# Patient Record
Sex: Female | Born: 1950 | Race: Black or African American | Hispanic: No | Marital: Married | Smoking: Never smoker
Health system: Southern US, Community
[De-identification: ages and names within clinical notes are randomized; demographics above are authoritative.]

## PROBLEM LIST (undated history)

## (undated) DIAGNOSIS — G629 Polyneuropathy, unspecified: Secondary | ICD-10-CM

## (undated) DIAGNOSIS — G510 Bell's palsy: Secondary | ICD-10-CM

## (undated) DIAGNOSIS — I10 Essential (primary) hypertension: Secondary | ICD-10-CM

## (undated) DIAGNOSIS — I639 Cerebral infarction, unspecified: Secondary | ICD-10-CM

## (undated) DIAGNOSIS — R609 Edema, unspecified: Secondary | ICD-10-CM

## (undated) DIAGNOSIS — M1990 Unspecified osteoarthritis, unspecified site: Secondary | ICD-10-CM

## (undated) DIAGNOSIS — E119 Type 2 diabetes mellitus without complications: Secondary | ICD-10-CM

## (undated) HISTORY — PX: THORACIC OUTLET SURGERY: SHX2502

## (undated) HISTORY — PX: BACK SURGERY: SHX140

## (undated) HISTORY — DX: Cerebral infarction, unspecified: I63.9

## (undated) HISTORY — PX: CARPAL TUNNEL RELEASE: SHX101

## (undated) HISTORY — PX: FIRST RIB REMOVAL: SHX642

## (undated) HISTORY — PX: NERVE SURGERY: SHX1016

## (undated) HISTORY — DX: Type 2 diabetes mellitus without complications: E11.9

## (undated) HISTORY — PX: ABDOMINAL HYSTERECTOMY: SHX81

## (undated) HISTORY — PX: APPENDECTOMY: SHX54

---

## 2007-07-07 DIAGNOSIS — I639 Cerebral infarction, unspecified: Secondary | ICD-10-CM

## 2007-07-07 HISTORY — DX: Cerebral infarction, unspecified: I63.9

## 2008-07-06 HISTORY — PX: LAPAROSCOPIC HYSTERECTOMY: SHX1926

## 2011-06-02 ENCOUNTER — Emergency Department: Payer: Self-pay | Admitting: Emergency Medicine

## 2011-08-21 ENCOUNTER — Ambulatory Visit: Payer: Self-pay | Admitting: Gastroenterology

## 2011-09-04 ENCOUNTER — Observation Stay: Payer: Self-pay | Admitting: Internal Medicine

## 2011-09-04 LAB — URINALYSIS, COMPLETE
Bacteria: NONE SEEN
Bilirubin,UR: NEGATIVE
Ketone: NEGATIVE
Protein: NEGATIVE
RBC,UR: 1 /HPF (ref 0–5)
Specific Gravity: 1.028 (ref 1.003–1.030)
Squamous Epithelial: NONE SEEN
WBC UR: 2 /HPF (ref 0–5)

## 2011-09-04 LAB — CBC WITH DIFFERENTIAL/PLATELET
Basophil #: 0 10*3/uL (ref 0.0–0.1)
Basophil %: 0.3 %
HCT: 46.8 % (ref 35.0–47.0)
Lymphocyte #: 4 10*3/uL — ABNORMAL HIGH (ref 1.0–3.6)
MCH: 30.2 pg (ref 26.0–34.0)
MCV: 90 fL (ref 80–100)
Monocyte #: 0.9 10*3/uL — ABNORMAL HIGH (ref 0.0–0.7)
Monocyte %: 7.5 %
Neutrophil #: 7.1 10*3/uL — ABNORMAL HIGH (ref 1.4–6.5)
Neutrophil %: 58.3 %
Platelet: 322 10*3/uL (ref 150–440)
RDW: 13.1 % (ref 11.5–14.5)

## 2011-09-04 LAB — COMPREHENSIVE METABOLIC PANEL
Albumin: 3.6 g/dL (ref 3.4–5.0)
Alkaline Phosphatase: 67 U/L (ref 50–136)
Anion Gap: 11 (ref 7–16)
Bilirubin,Total: 0.3 mg/dL (ref 0.2–1.0)
Calcium, Total: 9.9 mg/dL (ref 8.5–10.1)
Chloride: 98 mmol/L (ref 98–107)
Creatinine: 0.73 mg/dL (ref 0.60–1.30)
EGFR (African American): >60
Glucose: 360 mg/dL — ABNORMAL HIGH (ref 65–99)
Osmolality: 287 (ref 275–301)
Potassium: 4.7 mmol/L (ref 3.5–5.1)
Sodium: 137 mmol/L (ref 136–145)
Total Protein: 8.9 g/dL — ABNORMAL HIGH (ref 6.4–8.2)

## 2011-09-05 LAB — CBC WITH DIFFERENTIAL/PLATELET
Basophil #: 0 10*3/uL (ref 0.0–0.1)
Basophil %: 0.1 %
Eosinophil %: 1.9 %
HCT: 44 % (ref 35.0–47.0)
HGB: 14.6 g/dL (ref 12.0–16.0)
Lymphocyte #: 4.1 10*3/uL — ABNORMAL HIGH (ref 1.0–3.6)
MCH: 29.8 pg (ref 26.0–34.0)
MCV: 90 fL (ref 80–100)
Monocyte #: 0.7 10*3/uL (ref 0.0–0.7)
Monocyte %: 6.4 %
Neutrophil #: 5.4 10*3/uL (ref 1.4–6.5)
RBC: 4.89 10*6/uL (ref 3.80–5.20)
WBC: 10.3 10*3/uL (ref 3.6–11.0)

## 2011-09-05 LAB — BASIC METABOLIC PANEL
Anion Gap: 12 (ref 7–16)
BUN: 5 mg/dL — ABNORMAL LOW (ref 7–18)
Calcium, Total: 8.9 mg/dL (ref 8.5–10.1)
Chloride: 102 mmol/L (ref 98–107)
Co2: 27 mmol/L (ref 21–32)
Creatinine: 0.64 mg/dL (ref 0.60–1.30)
Osmolality: 284 (ref 275–301)
Potassium: 3.8 mmol/L (ref 3.5–5.1)

## 2011-09-05 LAB — HEMOGLOBIN A1C: Hemoglobin A1C: 13.7 % — ABNORMAL HIGH (ref 4.2–6.3)

## 2011-09-06 LAB — BASIC METABOLIC PANEL
Anion Gap: 7 (ref 7–16)
Calcium, Total: 8.8 mg/dL (ref 8.5–10.1)
Chloride: 107 mmol/L (ref 98–107)
Co2: 28 mmol/L (ref 21–32)
EGFR (African American): >60
Glucose: 127 mg/dL — ABNORMAL HIGH (ref 65–99)
Osmolality: 281 (ref 275–301)
Sodium: 142 mmol/L (ref 136–145)

## 2011-09-06 LAB — CBC WITH DIFFERENTIAL/PLATELET
Basophil #: 0 10*3/uL (ref 0.0–0.1)
Eosinophil #: 0.3 10*3/uL (ref 0.0–0.7)
Lymphocyte #: 3.2 10*3/uL (ref 1.0–3.6)
MCH: 30.1 pg (ref 26.0–34.0)
MCHC: 33.6 g/dL (ref 32.0–36.0)
MCV: 90 fL (ref 80–100)
Monocyte #: 0.7 10*3/uL (ref 0.0–0.7)
Platelet: 286 10*3/uL (ref 150–440)
RDW: 12.6 % (ref 11.5–14.5)

## 2011-09-06 LAB — STOOL CULTURE

## 2013-04-03 ENCOUNTER — Ambulatory Visit: Payer: Self-pay | Admitting: Internal Medicine

## 2013-04-14 ENCOUNTER — Ambulatory Visit: Payer: Self-pay | Admitting: Internal Medicine

## 2013-07-06 HISTORY — PX: CATARACT EXTRACTION: SUR2

## 2013-10-02 ENCOUNTER — Encounter (INDEPENDENT_AMBULATORY_CARE_PROVIDER_SITE_OTHER): Payer: Self-pay | Admitting: Ophthalmology

## 2013-10-25 ENCOUNTER — Encounter (INDEPENDENT_AMBULATORY_CARE_PROVIDER_SITE_OTHER): Payer: Medicare PPO | Admitting: Ophthalmology

## 2013-10-25 DIAGNOSIS — I10 Essential (primary) hypertension: Secondary | ICD-10-CM

## 2013-10-25 DIAGNOSIS — E1165 Type 2 diabetes mellitus with hyperglycemia: Secondary | ICD-10-CM

## 2013-10-25 DIAGNOSIS — H2510 Age-related nuclear cataract, unspecified eye: Secondary | ICD-10-CM

## 2013-10-25 DIAGNOSIS — E11319 Type 2 diabetes mellitus with unspecified diabetic retinopathy without macular edema: Secondary | ICD-10-CM

## 2013-10-25 DIAGNOSIS — H43819 Vitreous degeneration, unspecified eye: Secondary | ICD-10-CM

## 2013-10-25 DIAGNOSIS — E1139 Type 2 diabetes mellitus with other diabetic ophthalmic complication: Secondary | ICD-10-CM

## 2013-10-25 DIAGNOSIS — E11311 Type 2 diabetes mellitus with unspecified diabetic retinopathy with macular edema: Secondary | ICD-10-CM

## 2013-10-25 DIAGNOSIS — H35039 Hypertensive retinopathy, unspecified eye: Secondary | ICD-10-CM

## 2013-11-20 ENCOUNTER — Inpatient Hospital Stay: Payer: Self-pay | Admitting: Internal Medicine

## 2013-11-20 LAB — CK TOTAL AND CKMB (NOT AT ARMC)
CK, Total: 130 U/L
CK, Total: 118 U/L
CK-MB: <0.5 ng/mL — ABNORMAL LOW (ref 0.5–3.6)
CK-MB: 0.5 ng/mL (ref 0.5–3.6)

## 2013-11-20 LAB — COMPREHENSIVE METABOLIC PANEL
ALBUMIN: 3.1 g/dL — AB (ref 3.4–5.0)
Alkaline Phosphatase: 66 U/L
Anion Gap: 5 — ABNORMAL LOW (ref 7–16)
BILIRUBIN TOTAL: 0.2 mg/dL (ref 0.2–1.0)
BUN: 12 mg/dL (ref 7–18)
CO2: 30 mmol/L (ref 21–32)
CREATININE: 0.78 mg/dL (ref 0.60–1.30)
Calcium, Total: 9.4 mg/dL (ref 8.5–10.1)
Chloride: 106 mmol/L (ref 98–107)
EGFR (Non-African Amer.): >60
Glucose: 72 mg/dL (ref 65–99)
OSMOLALITY: 280 (ref 275–301)
POTASSIUM: 3.7 mmol/L (ref 3.5–5.1)
SGOT(AST): 31 U/L (ref 15–37)
SGPT (ALT): 27 U/L (ref 12–78)
SODIUM: 141 mmol/L (ref 136–145)
Total Protein: 7.8 g/dL (ref 6.4–8.2)

## 2013-11-20 LAB — URINALYSIS, COMPLETE
BILIRUBIN, UR: NEGATIVE
BLOOD: NEGATIVE
Bacteria: NONE SEEN
GLUCOSE, UR: NEGATIVE mg/dL (ref 0–75)
Ketone: NEGATIVE
Leukocyte Esterase: NEGATIVE
Nitrite: NEGATIVE
Ph: 6 (ref 4.5–8.0)
Protein: 30
RBC,UR: NONE SEEN /HPF (ref 0–5)
SPECIFIC GRAVITY: 1.011 (ref 1.003–1.030)
SQUAMOUS EPITHELIAL: NONE SEEN
WBC UR: <1 /HPF (ref 0–5)

## 2013-11-20 LAB — CBC
HCT: 40 % (ref 35.0–47.0)
HGB: 13.7 g/dL (ref 12.0–16.0)
MCH: 31.2 pg (ref 26.0–34.0)
MCHC: 34.1 g/dL (ref 32.0–36.0)
MCV: 92 fL (ref 80–100)
PLATELETS: 385 10*3/uL (ref 150–440)
RBC: 4.37 10*6/uL (ref 3.80–5.20)
RDW: 13 % (ref 11.5–14.5)
WBC: 10.7 10*3/uL (ref 3.6–11.0)

## 2013-11-20 LAB — TROPONIN I
Troponin-I: <0.02 ng/mL
Troponin-I: <0.02 ng/mL

## 2013-11-21 LAB — COMPREHENSIVE METABOLIC PANEL
AST: 29 U/L (ref 15–37)
Albumin: 3 g/dL — ABNORMAL LOW (ref 3.4–5.0)
Alkaline Phosphatase: 63 U/L
Anion Gap: 5 — ABNORMAL LOW (ref 7–16)
BUN: 8 mg/dL (ref 7–18)
Bilirubin,Total: 0.4 mg/dL (ref 0.2–1.0)
CALCIUM: 9.3 mg/dL (ref 8.5–10.1)
CHLORIDE: 104 mmol/L (ref 98–107)
CO2: 31 mmol/L (ref 21–32)
CREATININE: 0.52 mg/dL — AB (ref 0.60–1.30)
EGFR (Non-African Amer.): >60
GLUCOSE: 50 mg/dL — AB (ref 65–99)
Osmolality: 275 (ref 275–301)
Potassium: 3.3 mmol/L — ABNORMAL LOW (ref 3.5–5.1)
SGPT (ALT): 26 U/L (ref 12–78)
SODIUM: 140 mmol/L (ref 136–145)
TOTAL PROTEIN: 7.6 g/dL (ref 6.4–8.2)

## 2013-11-21 LAB — LIPID PANEL
Cholesterol: 178 mg/dL (ref 0–200)
HDL Cholesterol: 74 mg/dL — ABNORMAL HIGH (ref 40–60)
Ldl Cholesterol, Calc: 69 mg/dL (ref 0–100)
TRIGLYCERIDES: 177 mg/dL (ref 0–200)
VLDL CHOLESTEROL, CALC: 35 mg/dL (ref 5–40)

## 2013-11-21 LAB — CBC WITH DIFFERENTIAL/PLATELET
BASOS ABS: 0 10*3/uL (ref 0.0–0.1)
Basophil %: 0.5 %
Eosinophil #: 0.3 10*3/uL (ref 0.0–0.7)
Eosinophil %: 3.3 %
HCT: 39.3 % (ref 35.0–47.0)
HGB: 13.4 g/dL (ref 12.0–16.0)
LYMPHS PCT: 38.9 %
Lymphocyte #: 3.7 10*3/uL — ABNORMAL HIGH (ref 1.0–3.6)
MCH: 31.4 pg (ref 26.0–34.0)
MCHC: 34.2 g/dL (ref 32.0–36.0)
MCV: 92 fL (ref 80–100)
MONO ABS: 1 x10 3/mm — AB (ref 0.2–0.9)
Monocyte %: 10.1 %
Neutrophil #: 4.5 10*3/uL (ref 1.4–6.5)
Neutrophil %: 47.2 %
Platelet: 367 10*3/uL (ref 150–440)
RBC: 4.27 10*6/uL (ref 3.80–5.20)
RDW: 13.2 % (ref 11.5–14.5)
WBC: 9.4 10*3/uL (ref 3.6–11.0)

## 2013-11-22 ENCOUNTER — Encounter (INDEPENDENT_AMBULATORY_CARE_PROVIDER_SITE_OTHER): Payer: Medicare PPO | Admitting: Ophthalmology

## 2013-11-22 LAB — HEMOGLOBIN A1C: HEMOGLOBIN A1C: 7.7 % — AB (ref 4.2–6.3)

## 2013-12-05 ENCOUNTER — Ambulatory Visit: Payer: Self-pay | Admitting: Internal Medicine

## 2014-10-27 NOTE — Consult Note (Signed)
Referring Physician:  Theodoro Grist :   Primary Care Physician:  Theodoro Grist : [facility], [address] [place], [place] [address], [place]  Reason for Consult: Admit Date: 19-Nov-2013  Chief Complaint: L sided weakness  Reason for Consult: CVA   History of Present Illness: History of Present Illness:   64 yo RHD F presents to [facility] secondary to acute onset of L sided weakness in arm and face when she woke up 4 days ago this has persisted since that time and only mildly improved.  Pt reports headache that is severe as well when she does not normal get headaches.  She denies any vision changes but does state that her speech is more slurred. She is really concerned that she is having something more than a stroke.    ROS:  General weakness   HEENT no complaints   Lungs no complaints   Cardiac no complaints   GI no complaints   GU no complaints   Musculoskeletal no complaints   Extremities no complaints   Skin no complaints   Neuro headache  numbness/tingling   Endocrine no complaints   Psych no complaints   Past Medical/Surgical Hx:  Cataracts:   Bells Palsy:   Gastric paresis:   H-pylori:   bladder CA:   CVA/Stroke:   Diabetes:   Past Medical/ Surgical Hx:  Past Medical History as above   Past Surgical History as above   Home Medications: Medication Instructions Last Modified Date/Time  clopidogrel 75 mg oral tablet 1 tab(s) orally once a day 20-May-15 11:26  predniSONE 10 mg oral tablet 1 tab(s) orally once a day x 5 days 4 tab(s) orally once a day 3 tab(s) orally once a day 2 tab(s) orally once a day 1 tab(s) orally once a day  To be started after 7 day therapy with 60 mg daily dose 20-May-15 11:26  predniSONE 20 mg oral tablet 3 tab(s) orally once a day 20-May-15 11:26  amLODIPine 10 mg oral tablet 1 tab(s) orally once a day 20-May-15 11:26  valACYclovir 1 g oral tablet 1 tab(s) orally every 8 hours 20-May-15 11:26   acetaminophen-codeine #3 1 tab(s) orally every 6 hours, As Needed 18-May-15 12:12  Lantus 100 units/mL subcutaneous solution 15 unit(s) subcutaneous once a day (at bedtime) 19-May-15 16:21   Allergies:  Sulfa drugs: Anaphylaxis  Lisinopril: Hives  Statins: Unknown  Social/Family History: Lives With: significant other  Living Arrangements: house  Social History: no tob, no EtOh, 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, retired PA  Family History: no strokes but + DM   Vital Signs: **Vital Signs.:   20-May-15 11:50  Vital Signs Type Q 4hr  Temperature Temperature (F) 98.2  Celsius 36.7  Temperature Source oral  Pulse Pulse 71  Respirations Respirations 18  Systolic BP Systolic BP 546  Diastolic BP (mmHg) Diastolic BP (mmHg) 77  Mean BP 89  Pulse Ox % Pulse Ox % 97  Pulse Ox Activity Level  At rest  Oxygen Delivery Room Air/ 21 %   Physical Exam: General: mildly overweight, NAD, anxious mildly  HEENT: PERRLA, EOMI, nl VF, face symmetric, tongue midline, shoulder shrug equal  Neck: supple, no JVD, no bruits  Chest: CTA B, no wheezing, good movement  Cardiac: RRR, no murmurs, no edema, 2+ pulses  Extremities: no C/C/E, FROM   Neurologic Exam: Mental Status: alert and oriented x 3, normal language, follows complex commands, moderate dysarthria  Cranial Nerves: PERRLA, EOMI, nl VF, 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,  tongue midline, shoulder shrug equal, decreased hearing on L ear  Motor Exam: 5-/5 B, give-away weakness in L UE with nl tone  Deep Tendon Reflexes: 2+/4 B, plantars downgoing B, no Hoffman  Sensory Exam: spliting of the center line with facial pinpric;  vibration changes over the forehead  Coordination: FTN and HTS WNL, nl RAM   Lab Results: LabObservation:  18-May-15 14:16   OBSERVATION Reason for Test  Hepatic:  19-May-15 04:39   Bilirubin, Total 0.4  Alkaline Phosphatase 63 (45-117 NOTE: New Reference Range 05/26/13)  SGPT (ALT) 26  SGOT (AST) 29  Total Protein, Serum 7.6   Albumin, Serum  3.0  Routine Chem:  19-May-15 04:39   Hemoglobin A1c ([facility])  7.7 (The American Diabetes Association recommends that a primary goal of therapy should be <7% and that physicians should reevaluate the treatment regimen in patients with HbA1c values consistently >8%.)  Cholesterol, Serum 178  Triglycerides, Serum 177  HDL (INHOUSE)  74  VLDL Cholesterol Calculated 35  LDL Cholesterol Calculated 69 (Result(s) reported on 21 Nov 2013 at 05:58AM.)  Glucose, Serum  50  BUN 8  Creatinine (comp)  0.52  Sodium, Serum 140  Potassium, Serum  3.3  Chloride, Serum 104  CO2, Serum 31  Calcium (Total), Serum 9.3  Osmolality (calc) 275  eGFR (African American) >60  eGFR (Non-African American) >60 (eGFR values <16m/min/1.73 m2 may be an indication of chronic kidney disease (CKD). Calculated eGFR is useful in patients with stable renal function. The eGFR calculation will not be reliable in acutely ill patients when serum creatinine is changing rapidly. It is not useful in  patients on dialysis. The eGFR calculation may not be applicable to patients at the low and high extremes of body sizes, pregnant women, and vegetarians.)  Anion Gap  5  Cardiac:  18-May-15 19:51   Troponin I < 0.02 (0.00-0.05 0.05 ng/mL or less: NEGATIVE  Repeat testing in 3-6 hrs  if clinically indicated. >0.05 ng/mL: POTENTIAL  MYOCARDIAL INJURY. Repeat  testing in 3-6 hrs if  clinically indicated. NOTE: An increase or decrease  of 30% or more on serial  testing suggests a  clinically important change)  CK, Total 118 (26-192 NOTE: NEW REFERENCE RANGE  08/07/2013)  CPK-MB, Serum 0.5 (Result(s) reported on 20 Nov 2013 at 08:26PM.)  Routine UA:  18-May-15 12:16   Color (UA) Yellow  Clarity (UA) Clear  Glucose (UA) Negative  Bilirubin (UA) Negative  Ketones (UA) Negative  Specific Gravity (UA) 1.011  Blood (UA) Negative  pH (UA) 6.0  Protein (UA) 30 mg/dL  Nitrite (UA) Negative  Leukocyte  Esterase (UA) Negative (Result(s) reported on 20 Nov 2013 at 12:47PM.)  RBC (UA) NONE SEEN  WBC (UA) <1 /HPF  Bacteria (UA) NONE SEEN  Epithelial Cells (UA) NONE SEEN  Mucous (UA) PRESENT (Result(s) reported on 20 Nov 2013 at 12:47PM.)  Routine Hem:  19-May-15 04:39   WBC (CBC) 9.4  RBC (CBC) 4.27  Hemoglobin (CBC) 13.4  Hematocrit (CBC) 39.3  Platelet Count (CBC) 367  MCV 92  MCH 31.4  MCHC 34.2  RDW 13.2  Neutrophil % 47.2  Lymphocyte % 38.9  Monocyte % 10.1  Eosinophil % 3.3  Basophil % 0.5  Neutrophil # 4.5  Lymphocyte #  3.7  Monocyte #  1.0  Eosinophil # 0.3  Basophil # 0.0 (Result(s) reported on 21 Nov 2013 at 05:50AM.)   Radiology Results: [place]    18-May-15 16:12, [place] B[place]  [place]  Bilateral   REASON FOR EXAM:    CVA with L sided weakness  COMMENTS:       PROCEDURE: [place]  - US CAROTID DOPPLER BILATERAL  - Nov 20 2013  4:12PM     CLINICAL DATA:  Cerebral vascular accident, left-sided weakness    EXAM:  BILATERAL CAROTID DUPLEX ULTRASOUND    TECHNIQUE:  Pearline Cables scale imaging, color Doppler and duplex ultrasound were  performed of bilateral carotid and vertebral arteries in the neck.    COMPARISON:  Head CT 11/20/2013  FINDINGS:  Criteria: Quantification of carotid stenosis is based on velocity  parameters that correlate the residual internal carotid diameter  with NASCET-based stenosis levels, using the diameter of the distal  internal carotid lumen as the denominator for stenosis measurement.    The following velocity measurements were obtained:    RIGHT    ICA:  96/14 cm/sec    CCA:  93/71 cm/sec    SYSTOLIC ICA/CCA RATIO:  1.1  DIASTOLIC ICA/CCA RATIO:  0.8    ECA:  90 cm/sec    LEFT    ICA:  114/34 cm/sec    CCA:  69/67 cm/sec    SYSTOLIC ICA/CCA RATIO:  1.3    DIASTOLIC ICA/CCA RATIO:  2.0    ECA:  78 cm/sec  RIGHT CAROTID ARTERY: Mild smooth heterogeneous atherosclerotic  plaque in the proximal internal  carotid artery resulting in less  than 50% diameter narrowing.    RIGHT VERTEBRAL ARTERY:  Patent with normal antegrade flow.    LEFT CAROTID ARTERY: No significant atherosclerotic plaque or  evidence of stenosis.    LEFT VERTEBRAL ARTERY:  Patent with normal antegrade flow.     IMPRESSION:  1. Very mild smooth plaque results in a less than 50% diameter  narrowing in the right ICA.  2. No significant atherosclerotic plaque or evidence of stenosis on  the left.  3. Vertebral arteries are patent with normal antegrade flow.  Signed,    Criselda Peaches, MD    [facility]    [facility]      Electronically Signed    By: Jacqulynn Cadet M.D.    On: 11/20/2013 17:09     Verified By: Criselda Peaches, M.D.,  CT:    18-May-15 11:35, CT Head Without Contrast  CT Head Without Contrast   REASON FOR EXAM:    CVA  COMMENTS:   May transport without cardiac monitor    PROCEDURE: CT  - CT HEAD WITHOUT CONTRAST  - Nov 20 2013 11:35AM     CLINICAL DATA:  Slurred speech and facial droop; left-sided weakness    EXAM:  CT HEAD WITHOUT CONTRAST    TECHNIQUE:  Contiguous axial images were obtained from the base of the skull  through the vertex without intravenous contrast. Study was obtained  within 24 hr of patient's arrival at the emergency department.  COMPARISON:  None.    FINDINGS:  Theventricles are normal in size and configuration. There is no  appreciable mass, hemorrhage, extra-axial fluid collection, or  midline shift. No focal gray-white compartment lesions are  identified. There is no demonstrable acute infarct. Bony calvarium  appears intact. The mastoid air cells are clear.     IMPRESSION:  Study within normal limits. No appreciable mass, hemorrhage, or  acute appearing infarct.      Electronically Signed    By: Lowella Grip M.D.    On: 11/20/2013 12:01  Verified By: Leafy Kindle. 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 L hemiparesis-  exam is inconsistent and has undertones of conversion disorder with vibration changes over the forehead as well as splitting agree with Valcyclovir 1gm BID x 10 days also agree with prednisone dose pack needs eye patch and drops for L eye  needs speech OK to go home at anypoint will follow briefly in the hospital  Electronic Signatures: Jamison Neighbor (MD)  (Signed 20-May-15 15:29)  Authored: REFERRING PHYSICIAN, Primary Care Physician, Consult, History of Present Illness, Review of Systems, PAST MEDICAL/SURGICAL HISTORY, HOME MEDICATIONS, ALLERGIES, Social/Family History, NURSING VITAL SIGNS, Physical Exam-, LAB RESULTS, RADIOLOGY RESULTS, Recommendations   Last Updated: 20-May-15 15:29 by Jamison Neighbor (MD)

## 2014-10-27 NOTE — Discharge Summary (Signed)
PATIENT NAME:  Marcia Robbins, Marcia Robbins MR#:  [id number] DATE OF BIRTH:  May 19, 1951  DATE OF ADMISSION:  11/20/2013 DATE OF DISCHARGE:  11/22/2013  ADMITTING DIAGNOSIS: Stroke.   DISCHARGE DIAGNOSES: 1.  Acute left-sided Bell's palsy with dysphagia. 2.  Left-sided weakness of unclear etiology at this time, questionable conversion disorder, resolving. 3.  Malignant hypertension.  4.  Diabetes mellitus type 2 with hemoglobin A1c 7.7. 5.  Hyperlipidemia with LDL of 69.   DISCHARGE MEDICATIONS:   1.  Tylenol with codeine No. 3, 1 tablet every 6 hours as needed.  2.  Insulin Lantus 15 units subcutaneously at bedtime.  3.  Prednisone taper. The patient is to take prednisone 60 mg p.o. daily for 7 days and then taper by 10 mg until stopped. 4.  Plavix 75 mg p.o. daily.  5.  Amlodipine 10 mg p.o. daily.  6.  Valacyclovir 1 gram every 8 hours for 7 days.   The patient is not to take NovoLog Mix 70/30. She is not to take aspirin or Advil.   HOME OXYGEN: None.   DIET: 2 gram salt, low-fat, low-cholesterol, carbohydrate-controlled diet, soft to regular consistency depending on tolerance.  ACTIVITY LIMITATIONS: As tolerated.    DISCHARGE INSTRUCTIONS: 1.  Followup appointment with outpatient physical as well as speech therapy.   2.  Followup appointment with Dr. Maryellen PileEason in 2 days after discharge.  3.  The patient was also advised to protect her eye with moisturizing eye drops over-the-counter. Moisturizing eye drops will be sufficient. The patient was also advised to tape her left eye shut for night to protect cornea from drying.   CONSULTANTS: Neurologist, Dr. Mellody DrownMatthew Smith; care management, social work, physical therapy.   RADIOLOGIC STUDIES: Chest x-ray portable single view, 18th of May 2015, revealed no acute cardiopulmonary disease. CT of the head without contrast, 18th of May 2015, revealed Robbins normal study. Carotid ultrasound, 18th of May 2015, showed smooth plaque formation, which was less than 50%  of diameter narrowing, in the right ICA; no significant atherosclerotic plaque or evidence of stenosis on the left; vertebral arteries were patent with normal antegrade flow. MRI of brain with and without contrast, 19th of May 2015, showed no acute abnormality. Echocardiogram, 18th of May 2015, revealed no source of CVA or TIA; normal global left ventricular systolic function; left ventricular ejection fraction by visual estimation was 60% to 65%; impaired relaxation pattern with left ventricular diastolic filling; also mild left ventricular hypertrophy noted, as well as normal right ventricular size and systolic function and mild mitral valve regurgitation, high-normal right ventricular systolic pressures and questionable PFO.   HOSPITAL COURSE: The patient is Robbins 64 year old Caucasian female with Robbins history of Bell's palsy on the right, who presented to the hospital with complaints of left facial weakness as well as left-sided weakness. Please refer to Dr. Arlys JohnVaickute's admission note on the 18th of May 2015. On arrival to the hospital, the patient's vital signs: Temperature was 98.4. Pulse was 89. Respiratory rate was 18. Blood pressure 221/82. Saturation was 99% on room air. Physical exam revealed left facial weakness including left forehead, also left upper extremity as well as left lower extremity weakness.   The patient was admitted to the hospital with diagnosis of questionable stroke and stroke work-up was entertained. The patient underwent MRI of her brain, which showed no abnormalities. She was evaluated by neurologist, Dr. Mellody DrownMatthew Smith, who felt the patient very likely had one more, this time on the left side, Bell's palsy, which he  was concerned was related to viral disease. He recommended to start the patient on valacyclovir as well as steroid taper. The patient is to continue valacyclovir for approximately 7 days. She is to continue high doses of steroids for 7 days and then taper steroids off. She is  to follow up with her primary care physician for further recommendations. She is to protect her left eye from corneal drying, taping eye shut at nighttime as well as using moisturizing eye drops.   In regards to left-sided weakness, it was of unclear etiology. It was felt to be likely psychological by nature.   For malignant hypertension, the patient's blood pressure was much better controlled on current medications. The patient was advised to continue amlodipine and follow up with primary care physician for further recommendations in regards to management of her hypertension.   In regards to diabetes mellitus, the patient was noted to be hypoglycemic with her usual diabetic medications. Her insulin 70/30 was stopped. The patient was advised to continue to follow up with her primary care physician for further recommendations in regards to diabetes management.  For hyperlipidemia, the patient's LDL was checked and was found to be 69. The patient's total cholesterol was 178, triglycerides were 177, and HDL was 74. Initially since there was Robbins stroke suspected, the patient was started on Robbins statin; however, she refused to take statin. She is being discharged home on no cholesterol medication. She is to follow up with her primary care physician for further recommendations.   The patient is being discharged home with the above-mentioned medications and followup. On the day of discharge, temperature is 98.2. Pulse was 71. Respiratory rate was 18, blood pressure 115/77, saturation 97% on room air at rest.    ____________________________ Marcia Caper, MD rv:jcm D: 11/22/2013 17:29:30 ET T: 11/22/2013 19:56:48 ET JOB#: [id number]  cc: Marcia Caper, MD, <Dictator> Marcia Robbins. Maryellen Pile, MD Marcia Caper MD ELECTRONICALLY SIGNED 12/06/2013 8:36
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
## 2014-10-28 NOTE — Discharge Summary (Signed)
PATIENT NAME:  Marcia Robbins, Marcia Robbins MR#:  [id number] DATE OF BIRTH:  04-25-1951  DATE OF ADMISSION:  09/04/2011 DATE OF DISCHARGE:  09/07/2011  ADMITTING PHYSICIAN: Marcia Baas, MD   DISCHARGING PHYSICIAN: Marcia Baas, MD   PRIMARY CARE PHYSICIAN: Toy Cookey, MD    CONSULTATIONS IN THE HOSPITAL: GI consultation by Dr. Niel Hummer.   DISCHARGE DIAGNOSES:  1. Viral gastroenteritis with nausea, vomiting.  2. Tamoxifen-induced diarrhea.  3. Gastroesophageal reflux disease.  4. Gastroparesis.  5. Insulin-dependent diabetes mellitus with hemoglobin A1c of 13.7.  6. Diabetic neuropathy.  7. Helicobacter Pylori positivity on endoscopy and is to finish  two weeks of IV antibiotics.   DISCHARGE HOME MEDICATIONS:  1. Prevacid 30 mg p.o. b.i.d.  2. Lantus 25 units subcutaneous daily.  3. Reglan 5 mg p.o. t.i.d. prior to meals.  4. Zofran 4 mg p.o. orally-disintegrating tablet every 6  hours p.r.n.  5. Cymbalta 60 mg p.o. daily.  6. Maalox 30 mL p.o. every 6 hours p.r.n. for indigestion.   DISCHARGE DIET: ADA diet.   DISCHARGE ACTIVITY: As tolerated.    FOLLOW-UP INSTRUCTIONS:  1. Primary care physician followup in 1 to 2 weeks.  2. GI followup with Dr. Niel Hummer in 2 weeks.  LABORATORY, DIAGNOSTIC AND RADIOLOGICAL DATA:  Labs at the time of discharge: WBC 8.4, hemoglobin 13.3, hematocrit 40.2, platelet count 286. Sodium 142, potassium 3.8, chloride 107, bicarbonate 28, BUN 3, creatinine 0.61, glucose 127, calcium 8.8, lipase 17.  Hemoglobin A1c 13.7.  Stool culture is negative.  Stool for Clostridium difficile is negative.  Urinalysis: Negative for any infection. ALT 55, AST 45, alkaline phosphatase 67, total bilirubin 0.3, albumin of 3.6.  CT of the abdomen and pelvis with contrast showing normal liver, gallbladder, pancreas, adrenals, spleen and normal appendix. The bladder was nondistended. The bowel was nondistended. Left-sided nephrolithiasis. Kidneys are otherwise unremarkable.  No bowel obstruction noted. No hydronephrosis. The lung bases are clear. No free air is identified. Overall impression is normal CT of the abdomen.   BRIEF HOSPITAL COURSE:  Marcia Robbins is a 64 year old African American female with past medical history significant for diabetes, diabetic neuropathy, who was recently diagnosed with diabetic gastroparesis with an outpatient gastric emptying study which shows only 26% of emptying after 90 minutes. So she has been having some nausea, vomiting for 4 to 5 weeks. She has seen Dr. Niel Hummer for upper GI endoscopy which showed positive H. pylori and gastritis. She was placed on Prevacid and also antibiotics for H. pylori treatment. Since then she has been having acute on chronic nausea and vomiting for one week prior to admission, and she was unable to keep anything down and also had slimy mucoid diarrhea. So, she failed Reglan and Zofran as an outpatient and was sent in for a direct admit from Dr. Jake Church office.   1. Acute viral gastroenteritis, acute nausea and vomiting on top of gastroparesis symptoms: Likely viral illness, as per GI.  She was started on IV fluids, with symptomatic improvement; she was also started on IV Reglan q.i.d. for meals. Initially, she has been only on a clear liquid diet. She was on Zofran, Phenergan p.r.n. and has been gradually advancing diet. Right now she still has some nausea, which probably would be chronic with gastroparesis, but the acute vomiting episodes have been resolved, and she is able to take p.o. soft diet without any problem. GI followup  as an outpatient is recommended.  2. Gastroesophageal reflux disease and H. pylori: She will be on Prevacid  twice a day. She has finished treatment with Flagyl, amoxicillin and also Prevacid for two weeks as an outpatient. She could not tolerate outpatient clarithromycin  secondary to severe headache.  3. Diarrhea: Diarrhea while in the hospital was probably secondary to antibiotic-related.  She was on H. pylori treatment for the first two days into hospitalization with worsening diarrhea. Since she finished her two weeks, antibiotics were stopped and her diarrhea has resolved.  4. Insulin-dependent diabetes mellitus: Her hemoglobin A1c is 13.7. She is being started on Lantus 25 units subcutaneous daily from her prior Humalog 50/50 type insulin. She can continue with Dr. Maryellen PileEason for further management.  5. Diabetic neuropathy: The patient is on Cymbalta.   Her course has been otherwise uneventful in the hospital.   DISCHARGE CONDITION: Stable.   DISCHARGE DISPOSITION: Home.   TIME SPENT ON DISCHARGE: 40 minutes.   ____________________________ Marcia Baasadhika Kristina Mcnorton, MD rk:cbb D: 09/07/2011 14:52:25 ET T: 09/08/2011 13:52:11 ET JOB#: [id number]  cc: Marcia Baasadhika Sadae Arrazola, MD, <Dictator> Lurline DelShaukat Iftikhar, MD Serita ShellerErnest B. Maryellen PileEason, MD Marcia BaasADHIKA Ovida Delagarza MD ELECTRONICALLY SIGNED 09/15/2011 13:57

## 2014-10-28 NOTE — Consult Note (Signed)
PATIENT NAME:  Marcia Robbins, Marcia Robbins MR#:  [id number] DATE OF BIRTH:  [birth year]  DATE OF CONSULTATION:  09/05/2011  REFERRING PHYSICIAN:  Enid Baas, MD CONSULTING PHYSICIAN:  Lurline Del, MD  REASON FOR CONSULTATION: Nausea, vomiting, and diarrhea.   HISTORY OF PRESENT ILLNESS: The patient is a 64 year old African American female with a history of insulin-dependent diabetes, peripheral neuropathy.  The patient was seen about three weeks ago as outpatient with nausea and dyspepsia. An upper GI endoscopy showed gastritis. Biopsies were positive for Helicobacter. The patient was started on triple antibiotic therapy for H. pylori gastritis. A gastric emptying scan was done as well which was abnormal with gastric emptying of 26% at 90 minutes. The patient was already on Reglan 5 mg before meals and at bedtime, which was continued. The patient was admitted to the hospital yesterday with about 4 to 5 days of mucousy diarrhea and continues to be nauseated with frequent vomiting, especially worse within the last several days. The patient is also complaining of upper abdominal discomfort which she believes is secondary to her throwing up. Those symptoms are new and different than her baseline symptom of nausea.   PAST MEDICAL HISTORY: Significant for: 1. Insulin-dependent diabetes.  2. Diabetic neuropathy. 3. Diabetic gastroparesis. 4. History of cerebrovascular accident. 5. Bell's palsy.   HOME MEDICATIONS: Humalog, Cymbalta, vitamin, aspirin, as well as triple antibiotic for H. pylori and Reglan.   ALLERGIES: Lisinopril, sulfa and statins.   PAST SURGICAL HISTORY:  1. Tonsillectomy. 2. Appendectomy.  3. Hysterectomy.  4. Carpal tunnel surgery.   SOCIAL HISTORY: She lives at home. She does not smoke or drink.   FAMILY HISTORY: Unremarkable.   REVIEW OF SYSTEMS: Review of systems is grossly negative except for what is mentioned in the History of Present Illness.   PHYSICAL EXAMINATION:   GENERAL: A fairly well-built female. She does  appear to be septic or toxic.   VITAL SIGNS: Temperature 97.5, pulse 77, respirations 18, blood pressure 173/83.   HEENT: Examination is grossly unremarkable.   NECK: Veins are flat.   LUNGS: Lungs are grossly clear to auscultation bilaterally with fair air entry and no added sounds.   CARDIOVASCULAR: Regular rate and rhythm.    ABDOMEN: Examination showed a slightly distended abdomen. Bowel sounds are positive. Mild diffuse tenderness was noted in the upper abdominal area without any rebound or guarding.   EXTREMITIES: Extremities are without edema.   NEUROLOGICAL: Examination appears to be unremarkable.   LABORATORY, DIAGNOSTIC AND RADIOLOGICAL DATA:  Glucose was 360 on admission. Liver enzymes are unremarkable except for minimal elevation of AST at 45. BUN and creatinine are normal. I do not see serum amylase or lipase being done. White cell count is normal, as well as hemoglobin, and hematocrit, and platelet count.  Stool for Clostridium difficile toxin, WBC is negative. Stool cultures are negative as well.  CT scan of abdomen with contrast is unremarkable.   ASSESSMENT AND PLAN: Patient with diabetic gastroparesis.  Some of her nausea and vomiting is probably related to that. I agree with using Reglan 5 mg before meals IV, although if she continues to be symptomatic this can be increased to 10 mg IV before meals t.i.d. The patient's acute illness with nausea, vomiting, and diarrhea points more towards viral gastroenteritis although the nausea, vomiting, and diarrhea secondary to recent antibiotic use is another consideration. I agree with discontinuing the antibiotics and will continue the supportive care with PPI and antiemetics. The patient had a recent upper GI endoscopy  about three weeks ago which was unremarkable except for gastritis, and therefore I do not believe repeat upper GI endoscopy would be necessary. I will go ahead and  advance her diet a little bit and also obtain a serum lipase level. If the patient continues to be symptomatic, maybe an ultrasound of the gallbladder plus/minus a HIDA scan might be helpful to rule out any biliary pathology. We will follow.   Thank you so much, Dr. Nemiah CommanderKalisetti, for involving me in the care of Marcia Robbins.   ____________________________ Lurline DelShaukat Lennan Malone, MD si:cbb D: 09/05/2011 13:03:54 ET T: 09/05/2011 13:21:33 ET JOB#: [id number]  cc: Lurline DelShaukat [place], MD, <Dictator> Serita ShellerErnest B. Maryellen PileEason, MD Lurline DelSHAUKAT Hawkins Seaman MD ELECTRONICALLY SIGNED 09/05/2011 15:00
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
## 2015-07-18 ENCOUNTER — Encounter: Payer: Self-pay | Admitting: General Surgery

## 2015-07-18 ENCOUNTER — Ambulatory Visit (INDEPENDENT_AMBULATORY_CARE_PROVIDER_SITE_OTHER): Payer: Medicare HMO | Admitting: General Surgery

## 2015-07-18 VITALS — BP 162/82 | HR 80 | Resp 16 | Ht 59.0 in | Wt 125.0 lb

## 2015-07-18 DIAGNOSIS — N764 Abscess of vulva: Secondary | ICD-10-CM

## 2015-07-18 NOTE — Patient Instructions (Addendum)
The patient is aware to call back for any questions or concerns. Return in two to three weeks.

## 2015-07-18 NOTE — Progress Notes (Signed)
Patient ID: Marcia Robbins, female   DOB: 07/03/1951, 65 y.o.   MRN: [id number]  Chief Complaint  Patient presents with  . Other    abscess    HPI Marcia KinsMary A Kayes is a 65 y.o. female here today for a evaluation a left labia abscess. Patient states she noticed this area around Christmas and it has gotten worse. She states it is painful and it stated to drain this morning. She has an antibiotic but has not started it yet. Sh ehas been soaking in epsom salt to help. She is a brittle diabetic and her BS has been high ever since she got this infection I have reviewed the history of present illness with the patient.  HPI  Past Medical History  Diagnosis Date  . Diabetes mellitus without complication (HCC)   . Stroke [facility](HCC) 2009    Past Surgical History  Procedure Laterality Date  . Cataract extraction  2015  . Nerve surgery      C 5,6,7  . Laparoscopic hysterectomy  2010  . Carpal tunnel release Bilateral   . First rib removal      History reviewed. No pertinent family history.  Social History Social History  Substance Use Topics  . Smoking status: Never Smoker   . Smokeless tobacco: Never Used  . Alcohol Use: No    Allergies  Allergen Reactions  . Lisinopril Rash and Cough  . Sulfa Antibiotics Rash    Current Outpatient Prescriptions  Medication Sig Dispense Refill  . cholecalciferol (VITAMIN D) 1000 units tablet Take 1,000 Units by mouth daily.    . insulin aspart protamine- aspart (NOVOLOG MIX 70/30) (70-30) 100 UNIT/ML injection Inject 45 Units into the skin 2 (two) times daily with a meal.    . Insulin Glargine (LANTUS [place]) Inject 90 Units into the skin at bedtime.    . pregabalin (LYRICA) 75 MG capsule Take 75 mg by mouth 2 (two) times daily.     No current facility-administered medications for this visit.    Review of Systems Review of Systems  Constitutional: Positive for fever and chills.  Respiratory: Negative.   Cardiovascular: Negative.     Blood pressure  162/82, pulse 80, resp. rate 16, height 4\' 11"  (1.499 m), weight 125 lb (56.7 kg).  Physical Exam Physical Exam  Constitutional: She is oriented to person, place, and time. She appears well-developed and well-nourished.  HENT:  Mouth/Throat: Oropharynx is clear and moist.  Eyes: Conjunctivae are normal. No scleral icterus.  Neck: Neck supple.  Cardiovascular: Normal rate, regular rhythm and normal heart sounds.   Pulmonary/Chest: Effort normal and breath sounds normal.  Abdominal: Soft. Normal appearance and bowel sounds are normal. There is no tenderness.  Genitourinary:  4x3 cm left labial abscess. Small opening in middle.  Lymphadenopathy:    She has no cervical adenopathy.  Neurological: She is alert and oriented to person, place, and time.  Skin: Skin is warm and dry.  Psychiatric: Her behavior is normal.    Data Reviewed Notes reviewed   Assessment    Left labial abscess    Plan    With consent the abscess was drained.  Area was prepped with betadine. 1ml 1% xylocaine instilled. Cruciate incision made at site of the tiny opening and 1-2 ml pus drained. Bulk of the swelling is indurated,inflamed tissue. C/S sent. 4/4s and sanitary pad for dressing. No immediate problems from procedure  She is to start on her antibiotic today Patient to return in two three weeks.  PCP:  Toy Cookey B This information has been scribed by Dorathy Daft RNBC.   SANKAR,SEEPLAPUTHUR G 07/19/2015, 9:23 AM

## 2015-07-19 ENCOUNTER — Encounter: Payer: Self-pay | Admitting: General Surgery

## 2015-07-23 LAB — ANAEROBIC AND AEROBIC CULTURE

## 2015-08-05 ENCOUNTER — Ambulatory Visit (INDEPENDENT_AMBULATORY_CARE_PROVIDER_SITE_OTHER): Payer: Medicare HMO | Admitting: General Surgery

## 2015-08-05 ENCOUNTER — Encounter: Payer: Self-pay | Admitting: General Surgery

## 2015-08-05 VITALS — BP 118/76 | HR 70 | Resp 12 | Ht 59.0 in | Wt 125.0 lb

## 2015-08-05 DIAGNOSIS — N764 Abscess of vulva: Secondary | ICD-10-CM

## 2015-08-05 NOTE — Progress Notes (Signed)
This is a 65 year old female here today for her two week follow up left labial abscess. Patient states is doing much better with minimal drainage. I have reviewed the history of present illness with the patient.  Left labial swelling has gone down considerably. No redness or tenderness. No new findings  Culture grew staph aureus. Pt has been adequately treated with antibiotics. Patient to return as needed.  PCP:  Maryellen Pile, This information has been scribed by Ples Specter CMA.

## 2015-08-05 NOTE — Patient Instructions (Signed)
Patient to return as needed. 

## 2015-08-06 ENCOUNTER — Encounter: Payer: Self-pay | Admitting: General Surgery

## 2016-06-15 ENCOUNTER — Encounter: Payer: Self-pay | Admitting: *Deleted

## 2016-06-18 ENCOUNTER — Encounter: Payer: Self-pay | Admitting: Anesthesiology

## 2016-06-18 ENCOUNTER — Encounter
Admission: RE | Disposition: A | Discharge status: Home / Self Care | Payer: Self-pay | Source: Ambulatory Visit | Attending: Ophthalmology

## 2016-06-18 ENCOUNTER — Ambulatory Visit
Admission: RE | Admit: 2016-06-18 | Discharge: 2016-06-18 | Disposition: A | Discharge status: Home / Self Care | Payer: Medicare HMO | Source: Ambulatory Visit | Attending: Ophthalmology | Admitting: Ophthalmology

## 2016-06-18 ENCOUNTER — Ambulatory Visit: Payer: Medicare HMO | Admitting: Anesthesiology

## 2016-06-18 DIAGNOSIS — E1136 Type 2 diabetes mellitus with diabetic cataract: Secondary | ICD-10-CM | POA: Insufficient documentation

## 2016-06-18 DIAGNOSIS — M1990 Unspecified osteoarthritis, unspecified site: Secondary | ICD-10-CM | POA: Diagnosis not present

## 2016-06-18 DIAGNOSIS — Z8673 Personal history of transient ischemic attack (TIA), and cerebral infarction without residual deficits: Secondary | ICD-10-CM | POA: Insufficient documentation

## 2016-06-18 DIAGNOSIS — Z79899 Other long term (current) drug therapy: Secondary | ICD-10-CM | POA: Diagnosis not present

## 2016-06-18 DIAGNOSIS — I10 Essential (primary) hypertension: Secondary | ICD-10-CM | POA: Diagnosis not present

## 2016-06-18 DIAGNOSIS — G709 Myoneural disorder, unspecified: Secondary | ICD-10-CM | POA: Insufficient documentation

## 2016-06-18 HISTORY — DX: Edema, unspecified: R60.9

## 2016-06-18 HISTORY — DX: Polyneuropathy, unspecified: G62.9

## 2016-06-18 HISTORY — DX: Unspecified osteoarthritis, unspecified site: M19.90

## 2016-06-18 HISTORY — PX: CATARACT EXTRACTION W/PHACO: SHX586

## 2016-06-18 HISTORY — DX: Essential (primary) hypertension: I10

## 2016-06-18 HISTORY — DX: Bell's palsy: G51.0

## 2016-06-18 LAB — GLUCOSE, CAPILLARY: Glucose-Capillary: 162 mg/dL — ABNORMAL HIGH (ref 65–99)

## 2016-06-18 SURGERY — PHACOEMULSIFICATION, CATARACT, WITH IOL INSERTION
Anesthesia: Monitor Anesthesia Care | Site: Eye | Laterality: Right | Wound class: Clean

## 2016-06-18 MED ORDER — SODIUM CHLORIDE 0.9 % IV SOLN
INTRAVENOUS | Status: DC
  Administered 2016-06-18: 10:00:00 via INTRAVENOUS

## 2016-06-18 MED ORDER — BSS IO SOLN
INTRAOCULAR | Status: DC | PRN
  Administered 2016-06-18: 200 mL via INTRAOCULAR

## 2016-06-18 MED ORDER — MOXIFLOXACIN HCL 0.5 % OP SOLN
OPHTHALMIC | Status: DC | PRN
  Administered 2016-06-18: 9 [drp] via OPHTHALMIC

## 2016-06-18 MED ORDER — LIDOCAINE HCL (PF) 4 % IJ SOLN
INTRAOCULAR | Status: DC | PRN
  Administered 2016-06-18: 4 mL via OPHTHALMIC

## 2016-06-18 MED ORDER — ONDANSETRON HCL 4 MG/2ML IJ SOLN
INTRAMUSCULAR | Status: DC | PRN
  Administered 2016-06-18: 4 mg via INTRAVENOUS

## 2016-06-18 MED ORDER — ARMC OPHTHALMIC DILATING DROPS
OPHTHALMIC | Status: AC
  Administered 2016-06-18: 1 via OPHTHALMIC
  Filled 2016-06-18: qty 0.4

## 2016-06-18 MED ORDER — FENTANYL CITRATE (PF) 100 MCG/2ML IJ SOLN
INTRAMUSCULAR | Status: DC | PRN
  Administered 2016-06-18: 50 ug via INTRAVENOUS

## 2016-06-18 MED ORDER — MOXIFLOXACIN HCL 0.5 % OP SOLN: 1.0000 [drp] | OPHTHALMIC | Status: DC | PRN

## 2016-06-18 MED ORDER — MOXIFLOXACIN HCL 0.5 % OP SOLN
OPHTHALMIC | Status: AC
  Filled 2016-06-18: qty 3

## 2016-06-18 MED ORDER — SODIUM HYALURONATE 23 MG/ML IO SOLN
INTRAOCULAR | Status: DC | PRN
  Administered 2016-06-18: 0.6 mL via INTRAOCULAR

## 2016-06-18 MED ORDER — POVIDONE-IODINE 5 % OP SOLN
OPHTHALMIC | Status: AC
  Filled 2016-06-18: qty 30

## 2016-06-18 MED ORDER — SODIUM HYALURONATE 10 MG/ML IO SOLN
INTRAOCULAR | Status: AC
  Filled 2016-06-18: qty 0.85

## 2016-06-18 MED ORDER — ARMC OPHTHALMIC DILATING DROPS
1.0000 "application " | Freq: Once | OPHTHALMIC | Status: AC
  Administered 2016-06-18: 1 via OPHTHALMIC

## 2016-06-18 MED ORDER — EPINEPHRINE PF 1 MG/ML IJ SOLN
INTRAMUSCULAR | Status: AC
  Filled 2016-06-18: qty 1

## 2016-06-18 MED ORDER — SODIUM HYALURONATE 23 MG/ML IO SOLN
INTRAOCULAR | Status: AC
  Filled 2016-06-18: qty 0.6

## 2016-06-18 MED ORDER — SODIUM HYALURONATE 10 MG/ML IO SOLN
INTRAOCULAR | Status: DC | PRN
  Administered 2016-06-18: 0.85 mL via INTRAOCULAR

## 2016-06-18 MED ORDER — LIDOCAINE HCL (PF) 4 % IJ SOLN
INTRAMUSCULAR | Status: AC
  Filled 2016-06-18: qty 5

## 2016-06-18 MED ORDER — MIDAZOLAM HCL 2 MG/2ML IJ SOLN
INTRAMUSCULAR | Status: DC | PRN
Start: 2016-06-18 — End: 2016-06-18
  Administered 2016-06-18: 2 mg via INTRAVENOUS

## 2016-06-18 SURGICAL SUPPLY — 28 items
CANNULA ANT/CHMB 27GA (MISCELLANEOUS) ×6 IMPLANT
CATARACT PACK ×2 IMPLANT
CUP MEDICINE 2OZ PLAST GRAD ST (MISCELLANEOUS) ×3 IMPLANT
DISSECTOR HYDRO NUCLEUS 50X22 (MISCELLANEOUS) ×3 IMPLANT
GLOVE BIO SURGEON STRL SZ8 (GLOVE) ×3 IMPLANT
GLOVE BIOGEL M 6.5 STRL (GLOVE) ×3 IMPLANT
GLOVE SURG LX 7.5 STRW (GLOVE) ×2
GLOVE SURG LX STRL 7.5 STRW (GLOVE) ×1 IMPLANT
GOWN STRL REUS W/ TWL LRG LVL3 (GOWN DISPOSABLE) ×2 IMPLANT
GOWN STRL REUS W/TWL LRG LVL3 (GOWN DISPOSABLE) ×4
LENS IOL ACRSF IQ PC 25.5 (Intraocular Lens) ×1 IMPLANT
LENS IOL ACRYSOF IQ POST 25.5 (Intraocular Lens) ×3 IMPLANT
NEEDLE CAPSULORHEX 25GA (NEEDLE) ×2 IMPLANT
PACK CATARACT (MISCELLANEOUS) IMPLANT
PACK CATARACT BRASINGTON LX (MISCELLANEOUS) IMPLANT
PACK CATARACT KING (MISCELLANEOUS) ×3 IMPLANT
PACK EYE AFTER SURG (MISCELLANEOUS) ×3 IMPLANT
SOL BAL SALT 15ML (MISCELLANEOUS) ×3
SOL BSS BAG (MISCELLANEOUS) ×3
SOL PREP PVP 2OZ (MISCELLANEOUS) ×3
SOLUTION BAL SALT 15ML (MISCELLANEOUS) ×1 IMPLANT
SOLUTION BSS BAG (MISCELLANEOUS) ×1 IMPLANT
SOLUTION PREP PVP 2OZ (MISCELLANEOUS) ×1 IMPLANT
SYR 3ML LL SCALE MARK (SYRINGE) ×6 IMPLANT
SYR 5ML LL (SYRINGE) ×3 IMPLANT
SYR TB 1ML 27GX1/2 LL (SYRINGE) ×3 IMPLANT
WATER STERILE IRR 250ML POUR (IV SOLUTION) ×3 IMPLANT
WIPE NON LINTING 3.25X3.25 (MISCELLANEOUS) ×3 IMPLANT

## 2016-06-18 NOTE — Transfer of Care (Signed)
Immediate Anesthesia Transfer of Care Note  Patient: Marcia Robbins  Procedure(s) Performed: Procedure(s) with comments: CATARACT EXTRACTION PHACO AND INTRAOCULAR LENS PLACEMENT (IOC) (Right) - Lot #[id number] H US: 01:14.9 AP%: 10.4 CDE:7.91  Patient Location: PACU  Anesthesia Type:MAC  Level of Consciousness: awake  Airway & Oxygen Therapy: Patient Spontanous Breathing  Post-op Assessment: Report given to RN  Post vital signs: stable  Last Vitals:  Vitals:   06/18/16 0815 06/18/16 1019  BP: (!) 150/69 (!) 115/48  Pulse: 76 72  Resp: 16 16  Temp: 37.1 C 36.7 C    Last Pain:  Vitals:   06/18/16 0815  TempSrc: Oral         Complications: No apparent anesthesia complications

## 2016-06-18 NOTE — Discharge Instructions (Signed)
Eye Surgery Discharge Instructions  Expect mild scratchy sensation or mild soreness. DO NOT RUB YOUR EYE!  The day of surgery:  Minimal physical activity, but bed rest is not required  No reading, computer work, or close hand work  No bending, lifting, or straining.  May watch TV  For 24 hours:  No driving, legal decisions, or alcoholic beverages  Safety precautions  Eat anything you prefer: It is better to start with liquids, then soup then solid foods.  _____ Eye patch should be worn until postoperative exam tomorrow.  ____ Solar shield eyeglasses should be worn for comfort in the sunlight/patch while sleeping  Resume all regular medications including aspirin or Coumadin if these were discontinued prior to surgery. You may shower, bathe, shave, or wash your hair. Tylenol may be taken for mild discomfort.  Call your doctor if you experience significant pain, nausea, or vomiting, fever > 101 or other signs of infection. [phone] or [phone] Specific instructions:  Follow-up Information    Willey BladeBradley King, MD Follow up.   Specialty:  Ophthalmology Why:  December 15 at 10:45am Contact information: [address] [place] [address] [phone]

## 2016-06-18 NOTE — Anesthesia Postprocedure Evaluation (Signed)
Anesthesia Post Note  Patient: Marcia Robbins  Procedure(s) Performed: Procedure(s) (LRB): CATARACT EXTRACTION PHACO AND INTRAOCULAR LENS PLACEMENT (IOC) (Right)  Patient location during evaluation: Other Anesthesia Type: MAC Level of consciousness: awake and alert Pain management: pain level controlled Vital Signs Assessment: post-procedure vital signs reviewed and stable Respiratory status: spontaneous breathing, nonlabored ventilation, respiratory function stable and patient connected to nasal cannula oxygen Cardiovascular status: stable and blood pressure returned to baseline Anesthetic complications: no    Last Vitals:  Vitals:   06/18/16 1019 06/18/16 1039  BP: (!) 115/48 121/69  Pulse: 72 73  Resp: 16   Temp: 36.7 C     Last Pain:  Vitals:   06/18/16 0815  TempSrc: Oral                 Amira Podolak S

## 2016-06-18 NOTE — Op Note (Signed)
OPERATIVE NOTE  Marcia KinsMary A Cornell [id number] 06/18/2016   PREOPERATIVE DIAGNOSIS:  Nuclear sclerotic cataract right eye.  H25.11   POSTOPERATIVE DIAGNOSIS:    Nuclear sclerotic cataract right eye.     PROCEDURE:  Phacoemusification with posterior chamber intraocular lens placement of the right eye   LENS:   Implant Name Type Inv. Item Serial No. Manufacturer Lot No. LRB No. Used  IMPLANT LENS - [id number] Intraocular Lens IMPLANT LENS [id number] 002 ALCON   Right 1       [id number] +25.5   ULTRASOUND TIME: 1 minutes 14 seconds.  CDE 7.91   SURGEON:  Willey BladeBradley Yussef Jorge, MD, MPH  ANESTHESIOLOGIST: Anesthesiologist: Berdine AddisonMathai Thomas, MD CRNA: Pearla DubonnetXiufen Huang, CRNA   ANESTHESIA:  Topical with tetracaine drops augmented with 1% preservative-free intracameral lidocaine.  ESTIMATED BLOOD LOSS: less than 1 mL.   COMPLICATIONS:  None.   DESCRIPTION OF PROCEDURE:  The patient was identified in the holding room and transported to the operating room and placed in the supine position under the operating microscope.  The right eye was identified as the operative eye and it was prepped and draped in the usual sterile ophthalmic fashion.   A 1.0 millimeter clear-corneal paracentesis was made at the 10:30 position. 0.5 ml of preservative-free 1% lidocaine with epinephrine was injected into the anterior chamber.  The anterior chamber was filled with Healon 5 viscoelastic.  A 2.4 millimeter keratome was used to make a near-clear corneal incision at the 8:00 position.  A curvilinear capsulorrhexis was made with a cystotome and capsulorrhexis forceps.  Balanced salt solution was used to hydrodissect and hydrodelineate the nucleus.   Phacoemulsification was then used in stop and chop fashion to remove the lens nucleus and epinucleus.  The remaining cortex was then removed using the irrigation and aspiration handpiece. Healon was then placed into the capsular bag to distend it for lens placement.  A lens was then  injected into the capsular bag.  The remaining viscoelastic was aspirated.   Wounds were hydrated with balanced salt solution.  The anterior chamber was inflated to a physiologic pressure with balanced salt solution.   Intracameral vigamox 0.1 mL undiluted was injected into the eye and a drop placed onto the ocular surface.  No wound leaks were noted.  The patient was taken to the recovery room in stable condition without complications of anesthesia or surgery  Willey BladeBradley Emika Tiano 06/18/2016, 10:15 AM

## 2016-06-18 NOTE — Anesthesia Preprocedure Evaluation (Addendum)
Anesthesia Evaluation  Patient identified by MRN, date of birth, ID band Patient awake    Reviewed: Allergy & Precautions, NPO status , Patient's Chart, lab work & pertinent test results, reviewed documented beta blocker date and time   Airway Mallampati: III  TM Distance: >3 FB     Dental  (+) Chipped   Pulmonary           Cardiovascular hypertension, Pt. on medications      Neuro/Psych  Neuromuscular disease CVA    GI/Hepatic   Endo/Other  diabetes, Type 2  Renal/GU      Musculoskeletal  (+) Arthritis ,   Abdominal   Peds  Hematology   Anesthesia Other Findings Stroke in 2009, fully resolved.  Reproductive/Obstetrics                            Anesthesia Physical Anesthesia Plan  ASA: III  Anesthesia Plan: MAC   Post-op Pain Management:    Induction:   Airway Management Planned:   Additional Equipment:   Intra-op Plan:   Post-operative Plan:   Informed Consent: I have reviewed the patients History and Physical, chart, labs and discussed the procedure including the risks, benefits and alternatives for the proposed anesthesia with the patient or authorized representative who has indicated his/her understanding and acceptance.     Plan Discussed with: CRNA  Anesthesia Plan Comments:         Anesthesia Quick Evaluation

## 2016-06-18 NOTE — H&P (Signed)
The History and Physical notes are on paper, have been signed, and are to be scanned. The patient remains stable and unchanged from the H&P.   Previous H&P reviewed, patient examined, and there are no changes.  Marcia Robbins 06/18/2016 9:37 AM

## 2016-12-16 ENCOUNTER — Other Ambulatory Visit: Payer: Self-pay | Admitting: Internal Medicine

## 2016-12-16 DIAGNOSIS — N6310 Unspecified lump in the right breast, unspecified quadrant: Secondary | ICD-10-CM

## 2016-12-17 ENCOUNTER — Other Ambulatory Visit: Payer: Self-pay | Admitting: Internal Medicine

## 2016-12-17 DIAGNOSIS — N6310 Unspecified lump in the right breast, unspecified quadrant: Secondary | ICD-10-CM

## 2016-12-17 DIAGNOSIS — E113511 Type 2 diabetes mellitus with proliferative diabetic retinopathy with macular edema, right eye: Secondary | ICD-10-CM | POA: Diagnosis not present

## 2016-12-22 ENCOUNTER — Emergency Department
Admission: EM | Admit: 2016-12-22 | Discharge: 2016-12-22 | Disposition: A | Discharge status: Home / Self Care | Payer: PPO | Attending: Emergency Medicine | Admitting: Emergency Medicine

## 2016-12-22 DIAGNOSIS — Z8673 Personal history of transient ischemic attack (TIA), and cerebral infarction without residual deficits: Secondary | ICD-10-CM | POA: Insufficient documentation

## 2016-12-22 DIAGNOSIS — Z79899 Other long term (current) drug therapy: Secondary | ICD-10-CM | POA: Diagnosis not present

## 2016-12-22 DIAGNOSIS — I10 Essential (primary) hypertension: Secondary | ICD-10-CM | POA: Diagnosis not present

## 2016-12-22 DIAGNOSIS — R42 Dizziness and giddiness: Secondary | ICD-10-CM | POA: Diagnosis not present

## 2016-12-22 DIAGNOSIS — E860 Dehydration: Secondary | ICD-10-CM | POA: Insufficient documentation

## 2016-12-22 DIAGNOSIS — E1165 Type 2 diabetes mellitus with hyperglycemia: Secondary | ICD-10-CM | POA: Diagnosis not present

## 2016-12-22 DIAGNOSIS — R739 Hyperglycemia, unspecified: Secondary | ICD-10-CM

## 2016-12-22 DIAGNOSIS — Z794 Long term (current) use of insulin: Secondary | ICD-10-CM | POA: Diagnosis not present

## 2016-12-22 DIAGNOSIS — Z7982 Long term (current) use of aspirin: Secondary | ICD-10-CM | POA: Diagnosis not present

## 2016-12-22 LAB — CBC WITH DIFFERENTIAL/PLATELET
BASOS ABS: 0.1 10*3/uL (ref 0–0.1)
Basophils Relative: 1 %
EOS PCT: 3 %
Eosinophils Absolute: 0.3 10*3/uL (ref 0–0.7)
HEMATOCRIT: 43 % (ref 35.0–47.0)
Hemoglobin: 15 g/dL (ref 12.0–16.0)
Lymphocytes Relative: 29 %
Lymphs Abs: 3.3 10*3/uL (ref 1.0–3.6)
MCH: 29.8 pg (ref 26.0–34.0)
MCHC: 34.8 g/dL (ref 32.0–36.0)
MCV: 85.6 fL (ref 80.0–100.0)
Monocytes Absolute: 1 10*3/uL — ABNORMAL HIGH (ref 0.2–0.9)
Monocytes Relative: 9 %
NEUTROS ABS: 6.7 10*3/uL — AB (ref 1.4–6.5)
Neutrophils Relative %: 58 %
PLATELETS: 300 10*3/uL (ref 150–440)
RBC: 5.02 MIL/uL (ref 3.80–5.20)
RDW: 13.4 % (ref 11.5–14.5)
WBC: 11.5 10*3/uL — AB (ref 3.6–11.0)

## 2016-12-22 LAB — COMPREHENSIVE METABOLIC PANEL
ALT: 20 U/L (ref 14–54)
AST: 28 U/L (ref 15–41)
Albumin: 3.4 g/dL — ABNORMAL LOW (ref 3.5–5.0)
Alkaline Phosphatase: 74 U/L (ref 38–126)
Anion gap: 13 (ref 5–15)
BUN: 35 mg/dL — AB (ref 6–20)
CO2: 27 mmol/L (ref 22–32)
CREATININE: 1.48 mg/dL — AB (ref 0.44–1.00)
Calcium: 9.4 mg/dL (ref 8.9–10.3)
Chloride: 80 mmol/L — ABNORMAL LOW (ref 101–111)
GFR calc Af Amer: 42 mL/min — ABNORMAL LOW (ref >60)
GFR, EST NON AFRICAN AMERICAN: 36 mL/min — AB (ref >60)
GLUCOSE: 682 mg/dL — AB (ref 65–99)
Potassium: 4.5 mmol/L (ref 3.5–5.1)
Sodium: 120 mmol/L — ABNORMAL LOW (ref 135–145)
Total Bilirubin: 1.2 mg/dL (ref 0.3–1.2)
Total Protein: 7.7 g/dL (ref 6.5–8.1)

## 2016-12-22 LAB — URINALYSIS, COMPLETE (UACMP) WITH MICROSCOPIC
BILIRUBIN URINE: NEGATIVE
Glucose, UA: 500 mg/dL — AB
KETONES UR: NEGATIVE mg/dL
Leukocytes, UA: NEGATIVE
Nitrite: NEGATIVE
PROTEIN: 30 mg/dL — AB
pH: 5.5 (ref 5.0–8.0)

## 2016-12-22 LAB — BASIC METABOLIC PANEL
Anion gap: 10 (ref 5–15)
BUN: 28 mg/dL — ABNORMAL HIGH (ref 6–20)
CALCIUM: 8.9 mg/dL (ref 8.9–10.3)
CO2: 25 mmol/L (ref 22–32)
CREATININE: 1.05 mg/dL — AB (ref 0.44–1.00)
Chloride: 95 mmol/L — ABNORMAL LOW (ref 101–111)
GFR, EST NON AFRICAN AMERICAN: 55 mL/min — AB (ref >60)
Glucose, Bld: 368 mg/dL — ABNORMAL HIGH (ref 65–99)
Potassium: 3.9 mmol/L (ref 3.5–5.1)
SODIUM: 130 mmol/L — AB (ref 135–145)

## 2016-12-22 LAB — GLUCOSE, CAPILLARY: GLUCOSE-CAPILLARY: 559 mg/dL — AB (ref 65–99)

## 2016-12-22 LAB — TROPONIN I

## 2016-12-22 MED ORDER — SODIUM CHLORIDE 0.9 % IV BOLUS (SEPSIS)
1000.0000 mL | Freq: Once | INTRAVENOUS | Status: AC
  Administered 2016-12-22: 1000 mL via INTRAVENOUS

## 2016-12-22 MED ORDER — ACETAMINOPHEN 325 MG PO TABS
ORAL_TABLET | ORAL | Status: AC
  Filled 2016-12-22: qty 2

## 2016-12-22 MED ORDER — ONDANSETRON HCL 4 MG/2ML IJ SOLN
4.0000 mg | Freq: Once | INTRAMUSCULAR | Status: AC
  Administered 2016-12-22: 4 mg via INTRAVENOUS
  Filled 2016-12-22: qty 2

## 2016-12-22 MED ORDER — INSULIN ASPART 100 UNIT/ML ~~LOC~~ SOLN
10.0000 [IU] | Freq: Once | SUBCUTANEOUS | Status: AC
  Administered 2016-12-22: 10 [IU] via INTRAVENOUS
  Filled 2016-12-22: qty 10

## 2016-12-22 MED ORDER — ONDANSETRON HCL 4 MG PO TABS: 4.0000 mg | ORAL_TABLET | Freq: Every day | ORAL | 0 refills | Status: AC | PRN

## 2016-12-22 MED ORDER — POTASSIUM CHLORIDE CRYS ER 20 MEQ PO TBCR
40.0000 meq | EXTENDED_RELEASE_TABLET | Freq: Once | ORAL | Status: AC
  Administered 2016-12-22: 40 meq via ORAL
  Filled 2016-12-22: qty 2

## 2016-12-22 NOTE — ED Notes (Signed)
cbg [id number]

## 2016-12-22 NOTE — ED Triage Notes (Signed)
[facility]/o hyperglycemia, hypotension and weakness.

## 2016-12-22 NOTE — ED Notes (Signed)

## 2016-12-22 NOTE — ED Provider Notes (Signed)
[facility] Emergency Department Provider Note  ____________________________________________   First MD Initiated Contact with Patient 12/22/16 1524     (approximate)  I have reviewed the triage vital signs and the nursing notes.   HISTORY  Chief Complaint Hyperglycemia; Hypotension; and Weakness   HPI Marcia Robbins is a 66 y.o. female who is presenting after nursing will episode. She says that she was walking into a social services office when she began to feel lightheaded and like she was going to pass out. EMS arrived and found her blood pressure to be low and her blood glucose to be high. The patient and her daughter who is at bedside her blood sugar was 575. She denies any shortness of breath, pain, headache or any worsening numbness from her already baseline peripheral neuropathy from her diabetes. She says that she has felt lightheaded over the past several days especially something out of the heat for several hours yesterday and today. The patient denies a sensation of the room spinning. Denies any focal weakness. Does not report palpitations.  The patient also reports multiple episodes of vomiting over the past several days, every time she eats something. She says that she has been able to drink small amounts of water but if she drinks too much then she also vomits. Does not report any blood in the vomitus. Denies diarrhea.   Past Medical History:  Diagnosis Date  . Arthritis   . Bell's palsy   . Diabetes mellitus without complication (HCC)   . Edema    FEET/LEGS  . Hypertension   . Neuropathy   . Stroke [facility]) 2009    There are no active problems to display for this patient.   Past Surgical History:  Procedure Laterality Date  . ABDOMINAL HYSTERECTOMY    . APPENDECTOMY    . BACK SURGERY     NECK  . CARPAL TUNNEL RELEASE Bilateral   . CATARACT EXTRACTION  2015  . CATARACT EXTRACTION W/PHACO Right 06/18/2016   Procedure: CATARACT  EXTRACTION PHACO AND INTRAOCULAR LENS PLACEMENT (IOC);  Surgeon: Nevada Crane, MD;  Location: [facility] ORS;  Service: Ophthalmology;  Laterality: Right;  Lot #[id number] H [place]: 01:14.9 AP%: 10.4 CDE:7.91  . FIRST RIB REMOVAL    . LAPAROSCOPIC HYSTERECTOMY  2010  . NERVE SURGERY     C 5,6,7  . THORACIC OUTLET SURGERY      Prior to Admission medications   Medication Sig Start Date End Date Taking? Authorizing Provider  amLODipine (NORVASC) 5 MG tablet Take 5 mg by mouth every evening.    Yes [provider]  aspirin EC 81 MG tablet Take 81 mg by mouth every evening.    Yes [provider]  cholecalciferol (VITAMIN D) 1000 units tablet Take 1,000 Units by mouth every evening.    Yes [provider]  hydrochlorothiazide (MICROZIDE) 12.5 MG capsule Take 12.5 mg by mouth at bedtime.    Yes [provider]  LANTUS SOLOSTAR 100 UNIT/ML Solostar Pen Inject 110 Units as directed at bedtime.  04/30/16  Yes [provider]  NOVOLOG FLEXPEN 100 UNIT/ML FlexPen Inject 20-40 Units as directed 3 (three) times daily with meals. tk 40 units with breakfast, 20 units with lunch, and 20 units with dinner 04/30/16  Yes [provider]  pregabalin (LYRICA) 100 MG capsule Take 100-200 mg by mouth See admin instructions. tk 2 caps qam, 1 cap in the afternoon, and 2 caps qhs   Yes [provider]  Allergies Atorvastatin; Metformin and related; Lisinopril; and Sulfa antibiotics  No family history on file.  Social History Social History  Substance Use Topics  . Smoking status: Never Smoker  . Smokeless tobacco: Never Used  . Alcohol use No    Review of Systems  Constitutional: No fever/chills Eyes: No visual changes. ENT: No sore throat. Cardiovascular: Denies chest pain. Respiratory: Denies shortness of breath. Gastrointestinal: No abdominal pain.  No diarrhea.  No constipation. Genitourinary: Negative for dysuria. Musculoskeletal:  Negative for back pain. Skin: Negative for rash. Neurological: Negative for headaches, focal weakness or numbness.   ____________________________________________   PHYSICAL EXAM:  VITAL SIGNS: ED Triage Vitals [12/22/16 1525]  Enc Vitals Group     BP 133/78     Pulse Rate 79     Resp 17     Temp 98 F (36.7 C)     Temp Source Oral     SpO2 97 %     Weight 150 lb (68 kg)     Height 4\' 11"  (1.499 m)     Head Circumference      Peak Flow      Pain Score      Pain Loc      Pain Edu?      Excl. in GC?     Constitutional: Alert and oriented. Well appearing and in no acute distress. Eyes: Conjunctivae are normal.  Head: Atraumatic. Nose: No congestion/rhinnorhea. Mouth/Throat: Mucous membranes are moist.  Neck: No stridor.   Cardiovascular: Normal rate, regular rhythm. Grossly normal heart sounds.   Respiratory: Normal respiratory effort.  No retractions. Lungs CTAB. Gastrointestinal: Soft and nontender. No distention.  Musculoskeletal: No lower extremity tenderness nor edema.  No joint effusions. Neurologic:  Normal speech and language. No gross focal neurologic deficits are appreciated. No nystagmus. No ataxia on finger to nose testing. Decreased sensation to light touch to the bilateral lower and distal extremities which the patient says is her baseline. Skin:  Skin is warm, dry and intact. No rash noted. Psychiatric: Mood and affect are normal. Speech and behavior are normal.  ____________________________________________   LABS (all labs ordered are listed, but only abnormal results are displayed)  Labs Reviewed  CBC WITH DIFFERENTIAL/PLATELET - Abnormal; Notable for the following:       Result Value   WBC 11.5 (*)    Neutro Abs 6.7 (*)    Monocytes Absolute 1.0 (*)    All other components within normal limits  COMPREHENSIVE METABOLIC PANEL - Abnormal; Notable for the following:    Sodium 120 (*)    Chloride 80 (*)    Glucose, Bld 682 (*)    BUN 35 (*)     Creatinine, Ser 1.48 (*)    Albumin 3.4 (*)    GFR calc non Af Amer 36 (*)    GFR calc Af Amer 42 (*)    All other components within normal limits  URINALYSIS, COMPLETE (UACMP) WITH MICROSCOPIC - Abnormal; Notable for the following:    Specific Gravity, Urine <1.005 (*)    Glucose, UA 500 (*)    Hgb urine dipstick TRACE (*)    Protein, ur 30 (*)    Squamous Epithelial / LPF 0-5 (*)    Bacteria, UA FEW (*)    All other components within normal limits  BASIC METABOLIC PANEL - Abnormal; Notable for the following:    Sodium 130 (*)    Chloride 95 (*)    Glucose, Bld 368 (*)    BUN  28 (*)    Creatinine, Ser 1.05 (*)    GFR calc non Af Amer 55 (*)    All other components within normal limits  GLUCOSE, CAPILLARY - Abnormal; Notable for the following:    Glucose-Capillary 559 (*)    All other components within normal limits  TROPONIN I   ____________________________________________  EKG  ED ECG REPORT I, Genisis Sonnier,  Teena Irani, the attending physician, personally viewed and interpreted this ECG.   Date: 12/22/2016  EKG Time: 1611  Rate: 75  Rhythm: normal sinus rhythm  Axis: normal  Intervals:none  ST&T Change: No ST segment elevation or depression. No abnormal T-wave inversion.  ____________________________________________  RADIOLOGY   ____________________________________________   PROCEDURES  Procedure(s) performed:   Procedures  Critical Care performed:   ____________________________________________   INITIAL IMPRESSION / ASSESSMENT AND PLAN / ED COURSE  Pertinent labs & imaging results that were available during my care of the patient were reviewed by me and considered in my medical decision making (see chart for details).  ----------------------------------------- 9:14 PM on 12/22/2016 -----------------------------------------  Patient is a symptomatic at this time and sugars now the 300s. Likely dehydration with hyperglycemia causing her symptoms today.  She'll be discharged at this time. No complaints. Says that she will take her medicine at home. Also able tolerate by mouth. Will be discharged with Zofran and follow up with her primary care doctor. I encouraged her to drink plenty of fluids.      ____________________________________________   FINAL CLINICAL IMPRESSION(S) / ED DIAGNOSES  Hyperglycemia. Dehydration.    NEW MEDICATIONS STARTED DURING THIS VISIT:  New Prescriptions   No medications on file     Note:  This document was prepared using Dragon voice recognition software and may include unintentional dictation errors.     Myrna Blazer, MD 12/22/16 2115

## 2016-12-22 NOTE — ED Triage Notes (Signed)
[facility] and had a near syncope episode, called 911, [facility] refused EMS [facility], states she has been weak, lightheaded, hyperglycemia >500, b/p  102/60. States she has been vomiting.

## 2017-01-04 ENCOUNTER — Encounter: Payer: Self-pay | Admitting: Emergency Medicine

## 2017-01-04 ENCOUNTER — Emergency Department: Payer: PPO

## 2017-01-04 ENCOUNTER — Emergency Department
Admission: EM | Admit: 2017-01-04 | Discharge: 2017-01-04 | Disposition: A | Discharge status: Home / Self Care | Payer: PPO | Attending: Emergency Medicine | Admitting: Emergency Medicine

## 2017-01-04 DIAGNOSIS — K047 Periapical abscess without sinus: Secondary | ICD-10-CM | POA: Insufficient documentation

## 2017-01-04 DIAGNOSIS — E119 Type 2 diabetes mellitus without complications: Secondary | ICD-10-CM | POA: Insufficient documentation

## 2017-01-04 DIAGNOSIS — R220 Localized swelling, mass and lump, head: Secondary | ICD-10-CM

## 2017-01-04 DIAGNOSIS — R52 Pain, unspecified: Secondary | ICD-10-CM

## 2017-01-04 DIAGNOSIS — Z7982 Long term (current) use of aspirin: Secondary | ICD-10-CM | POA: Diagnosis not present

## 2017-01-04 DIAGNOSIS — R600 Localized edema: Secondary | ICD-10-CM | POA: Diagnosis present

## 2017-01-04 DIAGNOSIS — Z7984 Long term (current) use of oral hypoglycemic drugs: Secondary | ICD-10-CM | POA: Insufficient documentation

## 2017-01-04 DIAGNOSIS — Z79899 Other long term (current) drug therapy: Secondary | ICD-10-CM | POA: Insufficient documentation

## 2017-01-04 DIAGNOSIS — I10 Essential (primary) hypertension: Secondary | ICD-10-CM | POA: Diagnosis not present

## 2017-01-04 LAB — CBC WITH DIFFERENTIAL/PLATELET
Basophils Absolute: 0.1 10*3/uL (ref 0–0.1)
Basophils Relative: 1 %
EOS ABS: 0.3 10*3/uL (ref 0–0.7)
Eosinophils Relative: 2 %
HEMATOCRIT: 38.8 % (ref 35.0–47.0)
HEMOGLOBIN: 13.2 g/dL (ref 12.0–16.0)
LYMPHS ABS: 3.7 10*3/uL — AB (ref 1.0–3.6)
LYMPHS PCT: 24 %
MCH: 28.7 pg (ref 26.0–34.0)
MCHC: 34.1 g/dL (ref 32.0–36.0)
MCV: 84.1 fL (ref 80.0–100.0)
MONOS PCT: 10 %
Monocytes Absolute: 1.5 10*3/uL — ABNORMAL HIGH (ref 0.2–0.9)
NEUTROS ABS: 9.4 10*3/uL — AB (ref 1.4–6.5)
Neutrophils Relative %: 63 %
Platelets: 330 10*3/uL (ref 150–440)
RBC: 4.61 MIL/uL (ref 3.80–5.20)
RDW: 13.6 % (ref 11.5–14.5)
WBC: 15 10*3/uL — ABNORMAL HIGH (ref 3.6–11.0)

## 2017-01-04 LAB — HEPATIC FUNCTION PANEL
ALBUMIN: 2.9 g/dL — AB (ref 3.5–5.0)
ALT: 19 U/L (ref 14–54)
AST: 27 U/L (ref 15–41)
Alkaline Phosphatase: 64 U/L (ref 38–126)
BILIRUBIN TOTAL: 0.7 mg/dL (ref 0.3–1.2)
Bilirubin, Direct: 0.2 mg/dL (ref 0.1–0.5)
Indirect Bilirubin: 0.5 mg/dL (ref 0.3–0.9)
TOTAL PROTEIN: 7.9 g/dL (ref 6.5–8.1)

## 2017-01-04 LAB — BASIC METABOLIC PANEL
ANION GAP: 9 (ref 5–15)
BUN: 28 mg/dL — ABNORMAL HIGH (ref 6–20)
CO2: 29 mmol/L (ref 22–32)
Calcium: 9.5 mg/dL (ref 8.9–10.3)
Chloride: 95 mmol/L — ABNORMAL LOW (ref 101–111)
Creatinine, Ser: 1.21 mg/dL — ABNORMAL HIGH (ref 0.44–1.00)
GFR calc non Af Amer: 46 mL/min — ABNORMAL LOW (ref >60)
GFR, EST AFRICAN AMERICAN: 53 mL/min — AB (ref >60)
Glucose, Bld: 163 mg/dL — ABNORMAL HIGH (ref 65–99)
Potassium: 3.6 mmol/L (ref 3.5–5.1)
SODIUM: 133 mmol/L — AB (ref 135–145)

## 2017-01-04 LAB — BLOOD GAS, VENOUS
ACID-BASE EXCESS: 8.1 mmol/L — AB (ref 0.0–2.0)
Bicarbonate: 34.4 mmol/L — ABNORMAL HIGH (ref 20.0–28.0)
O2 Saturation: 66.9 %
PATIENT TEMPERATURE: 37
PO2 VEN: 34 mmHg (ref 32.0–45.0)
pCO2, Ven: 53 mmHg (ref 44.0–60.0)
pH, Ven: 7.42 (ref 7.250–7.430)

## 2017-01-04 LAB — LACTIC ACID, PLASMA: Lactic Acid, Venous: 1.5 mmol/L (ref 0.5–1.9)

## 2017-01-04 MED ORDER — HYDROCODONE-ACETAMINOPHEN 5-325 MG PO TABS: 1.0000 | ORAL_TABLET | Freq: Four times a day (QID) | ORAL | 0 refills | Status: AC | PRN

## 2017-01-04 MED ORDER — HYDROCODONE-ACETAMINOPHEN 5-325 MG PO TABS
1.0000 | ORAL_TABLET | Freq: Once | ORAL | Status: AC
Start: 2017-01-04 — End: 2017-01-04
  Administered 2017-01-04: 1 via ORAL

## 2017-01-04 MED ORDER — DEXTROSE 5 % IV SOLN
2.0000 g | Freq: Once | INTRAVENOUS | Status: AC
  Administered 2017-01-04: 2 g via INTRAVENOUS
  Filled 2017-01-04: qty 2

## 2017-01-04 MED ORDER — AMPICILLIN-SULBACTAM SODIUM 3 (2-1) G IJ SOLR
3.0000 g | Freq: Once | INTRAMUSCULAR | Status: AC
  Administered 2017-01-04: 3 g via INTRAVENOUS
  Filled 2017-01-04: qty 3

## 2017-01-04 MED ORDER — AMOXICILLIN 500 MG PO TABS: 500.0000 mg | ORAL_TABLET | Freq: Three times a day (TID) | ORAL | 0 refills | Status: AC

## 2017-01-04 MED ORDER — BUPIVACAINE HCL (PF) 0.5 % IJ SOLN
INTRAMUSCULAR | Status: AC
  Filled 2017-01-04: qty 30

## 2017-01-04 MED ORDER — BUPIVACAINE HCL (PF) 0.5 % IJ SOLN: 10.0000 mL | Freq: Once | INTRAMUSCULAR | Status: DC

## 2017-01-04 MED ORDER — IOPAMIDOL (ISOVUE-300) INJECTION 61%
75.0000 mL | Freq: Once | INTRAVENOUS | Status: AC | PRN
  Administered 2017-01-04: 75 mL via INTRAVENOUS

## 2017-01-04 MED ORDER — AMOXICILLIN-POT CLAVULANATE 875-125 MG PO TABS: 1.0000 | ORAL_TABLET | Freq: Two times a day (BID) | ORAL | 0 refills | Status: AC

## 2017-01-04 MED ORDER — FENTANYL CITRATE (PF) 100 MCG/2ML IJ SOLN
50.0000 ug | Freq: Once | INTRAMUSCULAR | Status: DC
Start: 2017-01-04 — End: 2017-01-04
  Filled 2017-01-04: qty 2

## 2017-01-04 MED ORDER — DEXAMETHASONE SODIUM PHOSPHATE 10 MG/ML IJ SOLN
10.0000 mg | Freq: Once | INTRAMUSCULAR | Status: AC
  Administered 2017-01-04: 10 mg via INTRAVENOUS
  Filled 2017-01-04: qty 1

## 2017-01-04 MED ORDER — SODIUM CHLORIDE 0.9 % IV BOLUS (SEPSIS)
1000.0000 mL | Freq: Once | INTRAVENOUS | Status: AC
  Administered 2017-01-04: 1000 mL via INTRAVENOUS

## 2017-01-04 MED ORDER — METHYLPREDNISOLONE ACETATE 80 MG/ML IJ SUSP
80.0000 mg | Freq: Once | INTRAMUSCULAR | Status: AC
  Administered 2017-01-04: 80 mg via INTRAMUSCULAR
  Filled 2017-01-04: qty 1

## 2017-01-04 NOTE — Discharge Instructions (Signed)
Today your CT scan was reassuring. It does show a half inch abscess on the left side of Marcia Robbins face that needs to be drained by a dentist. Today I spoke with the facial surgeon who recommended you receive IV unasyn and ceftriaxone (strong antibiotics) as well as dexamethasone and depomedrol (strong steroids) as well as taking augmentin and amoxicillin at home.  He recommends you call your dentist TODAY for a recheck.  Return to the ED for any concerns such as if you cannot eat or drink, if you become short of breath, or for any other concerns.  It was a pleasure to take care of you today, and thank you for coming to our emergency department.  If you have any questions or concerns before leaving please ask the nurse to grab me and I'm more than happy to go through your aftercare instructions again.  If you were prescribed any opioid pain medication today such as Norco, Vicodin, Percocet, morphine, hydrocodone, or oxycodone please make sure you do not drive when you are taking this medication as it can alter your ability to drive safely.  If you have any concerns once you are home that you are not improving or are in fact getting worse before you can make it to your follow-up appointment, please do not hesitate to call 911 and come back for further evaluation.  Merrily Brittle MD  Results for orders placed or performed during the hospital encounter of 01/04/17  Basic metabolic panel  Result Value Ref Range   Sodium 133 (L) 135 - 145 mmol/L   Potassium 3.6 3.5 - 5.1 mmol/L   Chloride 95 (L) 101 - 111 mmol/L   CO2 29 22 - 32 mmol/L   Glucose, Bld 163 (H) 65 - 99 mg/dL   BUN 28 (H) 6 - 20 mg/dL   Creatinine, Ser 1.61 (H) 0.44 - 1.00 mg/dL   Calcium 9.5 8.9 - 09.6 mg/dL   GFR calc non Af Amer 46 (L) >60 mL/min   GFR calc Af Amer 53 (L) >60 mL/min   Anion gap 9 5 - 15  Hepatic function panel  Result Value Ref Range   Total Protein 7.9 6.5 - 8.1 g/dL   Albumin 2.9 (L) 3.5 - 5.0 g/dL   AST 27 15 -  41 U/L   ALT 19 14 - 54 U/L   Alkaline Phosphatase 64 38 - 126 U/L   Total Bilirubin 0.7 0.3 - 1.2 mg/dL   Bilirubin, Direct 0.2 0.1 - 0.5 mg/dL   Indirect Bilirubin 0.5 0.3 - 0.9 mg/dL  CBC with Differential  Result Value Ref Range   WBC 15.0 (H) 3.6 - 11.0 K/uL   RBC 4.61 3.80 - 5.20 MIL/uL   Hemoglobin 13.2 12.0 - 16.0 g/dL   HCT 04.5 40.9 - 81.1 %   MCV 84.1 80.0 - 100.0 fL   MCH 28.7 26.0 - 34.0 pg   MCHC 34.1 32.0 - 36.0 g/dL   RDW 91.4 78.2 - 95.6 %   Platelets 330 150 - 440 K/uL   Neutrophils Relative % 63 %   Neutro Abs 9.4 (H) 1.4 - 6.5 K/uL   Lymphocytes Relative 24 %   Lymphs Abs 3.7 (H) 1.0 - 3.6 K/uL   Monocytes Relative 10 %   Monocytes Absolute 1.5 (H) 0.2 - 0.9 K/uL   Eosinophils Relative 2 %   Eosinophils Absolute 0.3 0 - 0.7 K/uL   Basophils Relative 1 %   Basophils Absolute 0.1 0 - 0.1 K/uL  Lactic acid, plasma  Result Value Ref Range   Lactic Acid, Venous 1.5 0.5 - 1.9 mmol/L  Blood gas, venous  Result Value Ref Range   pH, Ven 7.42 7.250 - 7.430   pCO2, Ven 53 44.0 - 60.0 mmHg   pO2, Ven 34.0 32.0 - 45.0 mmHg   Bicarbonate 34.4 (H) 20.0 - 28.0 mmol/L   Acid-Base Excess 8.1 (H) 0.0 - 2.0 mmol/L   O2 Saturation 66.9 %   Patient temperature 37.0    Collection site VEIN    Sample type VENOUS    Ct Soft Tissue Neck W Contrast  Result Date: 01/04/2017 CLINICAL DATA:  Left maxillofacial swelling and pain for 48 hours. Evaluate for abscess. EXAM: CT NECK WITH CONTRAST TECHNIQUE: Multidetector CT imaging of the neck was performed using the standard protocol following the bolus administration of intravenous contrast. CONTRAST:  75mL ISOVUE-300 IOPAMIDOL (ISOVUE-300) INJECTION 61% COMPARISON:  None. FINDINGS: Inflammation in the left face surrounding a irregularly-shaped 12 x 16 mm rim enhancing fluid collection antro lateral to the left maxilla, contiguous with devitalized solitary left upper molar which has periapical erosion. Pharynx and larynx: Normal. No  mass or swelling. Salivary glands: No inflammation, mass, or stone. Thyroid: Normal. Lymph nodes: None enlarged or abnormal density. Vascular: Tortuous ICA with partial retropharyngeal course. No acute finding. Limited intracranial: Negative Visualized orbits: No acute finding.  Bilateral cataract resection. Mastoids and visualized paranasal sinuses: Mucosal thickening on the floor of the left maxillary sinus, likely odontogenic. Skeleton: Simple appearing 15 mm lipoma in the left subscapularis region. 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 Upper chest: No acute finding. IMPRESSION: Odontogenic infection in the left face with 12 x 16 mm abscess antro-lateral to the left maxilla alveolar process. The remaining left upper molar is the offending tooth. Electronically Signed   By: Marnee SpringJonathon  Watts M.D.   On: 01/04/2017 08:59   OPTIONS FOR DENTAL FOLLOW UP CARE   Department of Health and Human Services - Local Safety Net Dental Clinics [email or web address]://www.ncdhhs.gov/dph/oralhealth/services/safetynetclinics.htm   [facility] [phone])  [facility] [phone])  [facility] [phone]  [facility] [phone])  [facility] [phone]) This clinic caters to the indigent population and is on a lottery system. Location: [employer] [address], [address], [place] [facility] Hours: Wednesdays from 6pm - 9pm, patients seen by a lottery system. For dates, call or go to [email or web address] Services: Cleanings, fillings and simple extractions. Payment Options: DENTAL WORK IS FREE OF CHARGE. Bring proof of income or support. Best way to get seen: Arrive at 5:15 pm - this is a lottery, NOT first come/first serve, so arriving earlier will not increase your chances of being seen.     [facility] [phone] Select option 1 for emergencies    Location: [facility], [facility], [address], [place] [facility] Hours: No walk-ins accepted - call the day before to schedule an appointment. Check in times are 9:30 am and 1:30 pm. Services: Simple extractions, temporary fillings, pulpectomy/pulp debridement, uncomplicated abscess drainage. Payment Options: PAYMENT IS DUE AT THE TIME OF SERVICE.  Fee is usually $100-200, additional surgical procedures (e.g. abscess drainage) may be extra. Cash, checks, Visa/MasterCard accepted.  Can file Medicaid if patient is covered for dental - patient should call case worker to check. No discount for [facility] patients. Best way to get seen: MUST call the day before and get onto the  schedule. Can usually be seen the next 1-2 days. No walk-ins accepted.     [facility] [phone]   Location: [facility], [address] [place] Clinic Hours: M, W, Th, F 8am or 1:30pm, Tues 9a or 1:30 - first come/first served. Services: Simple extractions, temporary fillings, uncomplicated abscess drainage.  You do not need to be an [facility] resident. Payment Options: PAYMENT IS DUE AT THE TIME OF SERVICE. Dental insurance, otherwise sliding scale - bring proof of income or support. Depending on income and treatment needed, cost is usually $50-200. Best way to get seen: Arrive early as it is first come/first served.     [facility] [phone]   Location: [address] Clinic Hours: Mon-Thu 8a-5p Services: Most basic dental services including extractions and fillings. Payment Options: PAYMENT IS DUE AT THE TIME OF SERVICE. Sliding scale, up to 50% off - bring proof if income or support. Medicaid with dental option accepted. Best way to get seen: Call to schedule an appointment, can usually be seen within 2 weeks OR they will try to see walk-ins - show up at 8a or 2p (you may have  to wait).     [facility] [phone] [place] RESIDENTS ONLY   Location: [facility], [address] [place], [place] [address] Clinic Hours: By appointment only. Monday - Thursday 8am-5pm, Friday 8am-12pm Services: Cleanings, fillings, extractions. Payment Options: PAYMENT IS DUE AT THE TIME OF SERVICE. Cash, Visa or MasterCard. Sliding scale - $30 minimum per service. Best way to get seen: Come in to office, complete packet and make an appointment - need proof of income or support monies for each household member and proof of [facility] residence. Usually takes about a month to get in.     [facility] [phone]   Location: [address], [facility] Clinic Hours: Walk-in Urgent Care Dental Services are offered Monday-Friday mornings only. The numbers of emergencies accepted daily is limited to the number of providers available. Maximum 15 - Mondays, Wednesdays & Thursdays Maximum 10 - Tuesdays & Fridays Services: You do not need to be a [facility] resident to be seen for a dental emergency. Emergencies are defined as pain, swelling, abnormal bleeding, or dental trauma. Walkins will receive x-rays if needed. NOTE: Dental cleaning is not an emergency. Payment Options: PAYMENT IS DUE AT THE TIME OF SERVICE. Minimum co-pay is $40.00 for uninsured patients. Minimum co-pay is $3.00 for Medicaid with dental coverage. Dental Insurance is accepted and must be presented at time of visit. Medicare does not cover dental. Forms of payment: Cash, credit card, checks. Best way to get seen: If not previously registered with the clinic, walk-in dental registration begins at 7:15 am and is on a first come/first serve basis. If previously registered with the clinic, call to make an appointment.     [facility] [phone] [place] RESIDENTS ONLY   Location: [address] [place],  [place] Clinic Hours: Mon-Thu 10a-2p Services: Extractions only! Payment Options: FREE (donations accepted) - bring proof of income or support Best way to get seen: Call and schedule an appointment OR come at 8am on the 1st Monday of every month (except for holidays) when it is first come/first served.     [facility] [phone]   Location: [address] [place], [place] Clinic Hours: Friday mornings Services, Payment Options, Best way to get seen: Call for info

## 2017-01-04 NOTE — ED Provider Notes (Signed)
[facility] Emergency Department Provider Note  ____________________________________________   First MD Initiated Contact with Patient 01/04/17 [phone]     (approximate)  I have reviewed the triage vital signs and the nursing notes.   HISTORY  Chief Complaint Facial Swelling    HPI Marcia Robbins is a 66 y.o. female who self presents to the emergency Department with roughly 5 days of facial swelling and difficulty eating solid foods. She said that she believe she has an infection in her tooth on the left side and she called her dentist who told her that he would not be able to treat her abscess until the infection got better. She has not been taking any antibiotics. She does have a history of insulin-dependent diabetes mellitus and her blood sugars have been running high in 4 to 500s. She came to the emergency department today out of frustration because her face has become contorted and her pain has worsened. She does decline pain medication at this time as she took 2 Benadryl this morning which she said helped with the pain. Her pain is moderate to severe on the left side of her face worse when eating improved with rest.   Past Medical History:  Diagnosis Date  . Arthritis   . Bell's palsy   . Diabetes mellitus without complication (HCC)   . Edema    FEET/LEGS  . Hypertension   . Neuropathy   . Stroke [facility](HCC) 2009    There are no active problems to display for this patient.   Past Surgical History:  Procedure Laterality Date  . ABDOMINAL HYSTERECTOMY    . APPENDECTOMY    . BACK SURGERY     NECK  . CARPAL TUNNEL RELEASE Bilateral   . CATARACT EXTRACTION  2015  . CATARACT EXTRACTION W/PHACO Right 06/18/2016   Procedure: CATARACT EXTRACTION PHACO AND INTRAOCULAR LENS PLACEMENT (IOC);  Surgeon: Nevada CraneBradley Mark King, MD;  Location: [facility] ORS;  Service: Ophthalmology;  Laterality: Right;  Lot #[id number] H US: 01:14.9 AP%: 10.4 CDE:7.91  . FIRST RIB REMOVAL      . LAPAROSCOPIC HYSTERECTOMY  2010  . NERVE SURGERY     C 5,6,7  . THORACIC OUTLET SURGERY      Prior to Admission medications   Medication Sig Start Date End Date Taking? Authorizing Provider  amLODipine (NORVASC) 5 MG tablet Take 5 mg by mouth every evening.     [provider]  amoxicillin (AMOXIL) 500 MG tablet Take 1 tablet (500 mg total) by mouth 3 (three) times daily. 01/04/17 01/11/17  Merrily Brittleifenbark, Jamia Hoban, MD  amoxicillin-clavulanate (AUGMENTIN) 875-125 MG tablet Take 1 tablet by mouth 2 (two) times daily. 01/04/17 01/11/17  Merrily Brittleifenbark, Renezmae Canlas, MD  aspirin EC 81 MG tablet Take 81 mg by mouth every evening.     [provider]  cholecalciferol (VITAMIN D) 1000 units tablet Take 1,000 Units by mouth every evening.     [provider]  hydrochlorothiazide (MICROZIDE) 12.5 MG capsule Take 12.5 mg by mouth at bedtime.     [provider]  HYDROcodone-acetaminophen (NORCO) 5-325 MG tablet Take 1 tablet by mouth every 6 (six) hours as needed for severe pain. 01/04/17   Merrily Brittleifenbark, Vivian Neuwirth, MD  LANTUS SOLOSTAR 100 UNIT/ML Solostar Pen Inject 110 Units as directed at bedtime.  04/30/16   [provider]  NOVOLOG FLEXPEN 100 UNIT/ML FlexPen Inject 20-40 Units as directed 3 (three) times daily with meals. tk 40 units with breakfast, 20 units with lunch, and 20 units  with dinner 04/30/16   [provider]  ondansetron (ZOFRAN) 4 MG tablet Take 1 tablet (4 mg total) by mouth daily as needed. 12/22/16   Myrna Blazer, MD  pregabalin (LYRICA) 100 MG capsule Take 100-200 mg by mouth See admin instructions. tk 2 caps qam, 1 cap in the afternoon, and 2 caps qhs    [provider]    Allergies Atorvastatin; Metformin and related; Lisinopril; and Sulfa antibiotics  History reviewed. No pertinent family history.  Social History Social History  Substance Use Topics  . Smoking status: Never Smoker  . Smokeless tobacco: Never Used  . Alcohol use No     Review of Systems Constitutional: No fever/chills Eyes: No visual changes. ENT: No sore throat. Cardiovascular: Denies chest pain. Respiratory: Denies shortness of breath. Gastrointestinal: No abdominal pain.  No nausea, no vomiting.  No diarrhea.  No constipation. Genitourinary: Negative for dysuria. Musculoskeletal: Negative for back pain. Skin: Negative for rash. Neurological: Negative for headaches, focal weakness or numbness.   ____________________________________________   PHYSICAL EXAM:  VITAL SIGNS: ED Triage Vitals [01/04/17 0754]  Enc Vitals Group     BP 108/60     Pulse Rate 75     Resp 18     Temp 98.9 F (37.2 C)     Temp Source Oral     SpO2 97 %     Weight 150 lb (68 kg)     Height 4\' 11"  (1.499 m)     Head Circumference      Peak Flow      Pain Score 0     Pain Loc      Pain Edu?      Excl. in GC?     Constitutional: Alert and oriented 4 somewhat muffled speech with face obviously swollen on the left Eyes: PERRL EOMI. Head: Atraumatic. Nose: No congestion/rhinnorhea. Mouth/Throat: Mild trismus obvious dental abscess on the left upper tooth. Abscess appears to extend upwards towards the canine space with obliteration of her nasolabial fold No sublingual or submental induration or tenderness no skin color changes Neck: No stridor.   Cardiovascular: Normal rate, regular rhythm. Grossly normal heart sounds.  Good peripheral circulation. Respiratory: Normal respiratory effort.  No retractions. Lungs CTAB and moving good air Gastrointestinal: Soft nontender Musculoskeletal: No lower extremity edema   Neurologic:  Normal speech and language. No gross focal neurologic deficits are appreciated. Skin:  Skin is warm, dry and intact. No rash noted. Psychiatric: Mood and affect are normal. Speech and behavior are normal.    ____________________________________________   DIFFERENTIAL includes but not limited to  Ludwig angina, deep space infection,  dental infection, sepsis, periapical abscess ____________________________________________   LABS (all labs ordered are listed, but only abnormal results are displayed)  Labs Reviewed  BASIC METABOLIC PANEL - Abnormal; Notable for the following:       Result Value   Sodium 133 (*)    Chloride 95 (*)    Glucose, Bld 163 (*)    BUN 28 (*)    Creatinine, Ser 1.21 (*)    GFR calc non Af Amer 46 (*)    GFR calc Af Amer 53 (*)    All other components within normal limits  HEPATIC FUNCTION PANEL - Abnormal; Notable for the following:    Albumin 2.9 (*)    All other components within normal limits  CBC WITH DIFFERENTIAL/PLATELET - Abnormal; Notable for the following:    WBC 15.0 (*)    Neutro Abs 9.4 (*)  Lymphs Abs 3.7 (*)    Monocytes Absolute 1.5 (*)    All other components within normal limits  BLOOD GAS, VENOUS - Abnormal; Notable for the following:    Bicarbonate 34.4 (*)    Acid-Base Excess 8.1 (*)    All other components within normal limits  LACTIC ACID, PLASMA    Elevated white count is nonspecific no signs of DKA __________________________________________  EKG   ____________________________________________  RADIOLOGY  CT scan of the face shows no deep space infection but does show small left anterior facial abscess ____________________________________________   PROCEDURES  Procedure(s) performed: no  Procedures  Critical Care performed: no  Observation: no ____________________________________________   INITIAL IMPRESSION / ASSESSMENT AND PLAN / ED COURSE  Pertinent labs & imaging results that were available during my care of the patient were reviewed by me and considered in my medical decision making (see chart for details).  The patient arrives with obvious left facial swelling although is protecting her airway and not drooling. She does have some amount of trismus and on exam an obvious left upper facial abscess which appears to be a canine  abscess. Labs Unasyn and CT scan of the face are pending.     ----------------------------------------- 9:46 AM on 01/04/2017 -----------------------------------------  The patient's pain has returned and she is requesting pain medication at this time. Her CT scan does show a left sided abscess of odontogenic origin. I will touch base with otolaryngology now but if they're unable to care for the patient she will require transfer. ____________________________________________   ----------------------------------------- 9:52 AM on 01/04/2017 -----------------------------------------  I discussed the case with on-call otolaryngologist Dr. Jenne Campus who recommended: 3gm IV unasyn, 2gm ceftriaxone, 10mg  IV dexamethasone, and 80mg  IM depomedrol.  He then recommends discharge with dental follow up today along with augmentin 875mg  twice a day and amoxicillin 500mg  TID.   ----------------------------------------- 11:07 AM on 01/04/2017 -----------------------------------------  The patient remains well-appearing and is tolerating oral fluids and food as well as pain medication. She has no signs of respiratory compromise. I offered to call the patient's dentist in order to help set up an appointment for later on today but she cannot remember the dentist name. She said she has it written down at home and told me that she will absolutely be able to follow up later on today. She will be discharged home in improved condition.  FINAL CLINICAL IMPRESSION(S) / ED DIAGNOSES  Final diagnoses:  Pain  Dental abscess  Facial swelling      NEW MEDICATIONS STARTED DURING THIS VISIT:  New Prescriptions   AMOXICILLIN (AMOXIL) 500 MG TABLET    Take 1 tablet (500 mg total) by mouth 3 (three) times daily.   AMOXICILLIN-CLAVULANATE (AUGMENTIN) 875-125 MG TABLET    Take 1 tablet by mouth 2 (two) times daily.   HYDROCODONE-ACETAMINOPHEN (NORCO) 5-325 MG TABLET    Take 1 tablet by mouth every 6 (six) hours as  needed for severe pain.     Note:  This document was prepared using Dragon voice recognition software and may include unintentional dictation errors.     Merrily Brittle, MD 01/04/17 [phone]

## 2017-01-04 NOTE — ED Triage Notes (Signed)
Pt to ed with c/o left sided facila swelling that started on Saturday.  Pt reports the dentist will not help her with abscessed tooth until the swelling goes down.

## 2017-01-05 DIAGNOSIS — I10 Essential (primary) hypertension: Secondary | ICD-10-CM | POA: Diagnosis not present

## 2017-01-05 DIAGNOSIS — Z1159 Encounter for screening for other viral diseases: Secondary | ICD-10-CM | POA: Diagnosis not present

## 2017-01-12 DIAGNOSIS — K122 Cellulitis and abscess of mouth: Secondary | ICD-10-CM | POA: Diagnosis not present

## 2017-01-12 DIAGNOSIS — Z23 Encounter for immunization: Secondary | ICD-10-CM | POA: Diagnosis not present

## 2017-01-12 DIAGNOSIS — Z780 Asymptomatic menopausal state: Secondary | ICD-10-CM | POA: Diagnosis not present

## 2017-01-12 DIAGNOSIS — B182 Chronic viral hepatitis C: Secondary | ICD-10-CM | POA: Diagnosis not present

## 2017-01-12 DIAGNOSIS — Z0000 Encounter for general adult medical examination without abnormal findings: Secondary | ICD-10-CM | POA: Diagnosis not present

## 2017-01-12 DIAGNOSIS — R600 Localized edema: Secondary | ICD-10-CM | POA: Diagnosis not present

## 2017-01-12 DIAGNOSIS — R768 Other specified abnormal immunological findings in serum: Secondary | ICD-10-CM | POA: Diagnosis not present

## 2017-01-14 ENCOUNTER — Ambulatory Visit
Admission: RE | Admit: 2017-01-14 | Discharge: 2017-01-14 | Disposition: A | Discharge status: Home / Self Care | Payer: PPO | Source: Ambulatory Visit | Attending: Internal Medicine | Admitting: Internal Medicine

## 2017-01-14 DIAGNOSIS — N6310 Unspecified lump in the right breast, unspecified quadrant: Secondary | ICD-10-CM | POA: Insufficient documentation

## 2017-01-14 DIAGNOSIS — R928 Other abnormal and inconclusive findings on diagnostic imaging of breast: Secondary | ICD-10-CM | POA: Diagnosis not present

## 2017-01-14 DIAGNOSIS — N6324 Unspecified lump in the left breast, lower inner quadrant: Secondary | ICD-10-CM | POA: Insufficient documentation

## 2017-01-14 DIAGNOSIS — N6489 Other specified disorders of breast: Secondary | ICD-10-CM | POA: Diagnosis not present

## 2017-03-02 DIAGNOSIS — Z794 Long term (current) use of insulin: Secondary | ICD-10-CM | POA: Diagnosis not present

## 2017-03-02 DIAGNOSIS — E1136 Type 2 diabetes mellitus with diabetic cataract: Secondary | ICD-10-CM | POA: Diagnosis not present

## 2017-03-02 DIAGNOSIS — E1165 Type 2 diabetes mellitus with hyperglycemia: Secondary | ICD-10-CM | POA: Diagnosis not present

## 2017-03-02 DIAGNOSIS — K047 Periapical abscess without sinus: Secondary | ICD-10-CM | POA: Diagnosis not present

## 2017-04-27 DIAGNOSIS — I10 Essential (primary) hypertension: Secondary | ICD-10-CM | POA: Diagnosis not present

## 2017-04-27 DIAGNOSIS — L0293 Carbuncle, unspecified: Secondary | ICD-10-CM | POA: Diagnosis not present

## 2017-04-27 DIAGNOSIS — G629 Polyneuropathy, unspecified: Secondary | ICD-10-CM | POA: Diagnosis not present

## 2017-04-27 DIAGNOSIS — Z0000 Encounter for general adult medical examination without abnormal findings: Secondary | ICD-10-CM | POA: Diagnosis not present

## 2017-04-27 DIAGNOSIS — R600 Localized edema: Secondary | ICD-10-CM | POA: Diagnosis not present

## 2017-07-14 IMAGING — CT CT NECK W/ CM
3 of 4 series · 13 of 33 positions shown, 16 images · IV contrast (iopamidol)
Comparison: None.

CLINICAL DATA: Left maxillofacial swelling and pain for 48 hours.
Evaluate for abscess.

EXAM:
CT NECK WITH CONTRAST
TECHNIQUE: Multidetector CT imaging of the neck was performed using the
standard protocol following the bolus administration of intravenous
contrast.
CONTRAST:  75mL 2PETXV-3PP IOPAMIDOL (2PETXV-3PP) INJECTION 61%

[Series 6: sag neck · sagittal · 0.44mm/px · 5 of 101 slices shown, 6 images]
[im 34/101  bone]
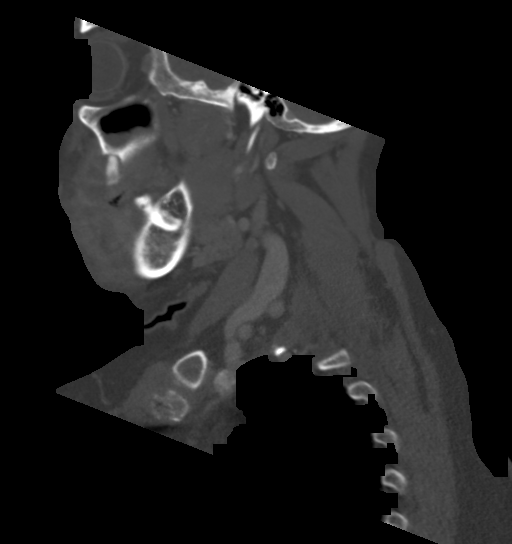
[im 42/101  bone]
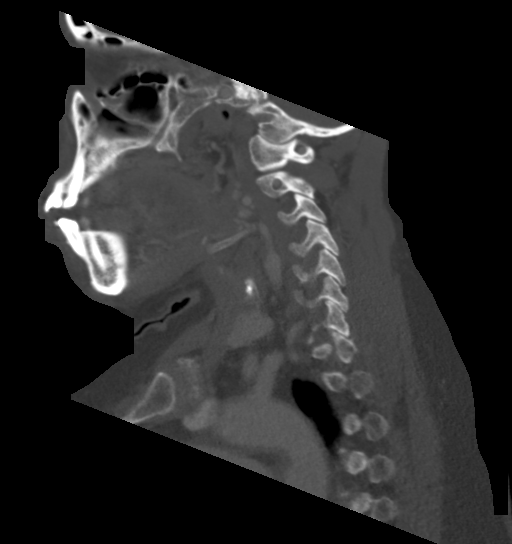
[im 51/101  soft-tissue]
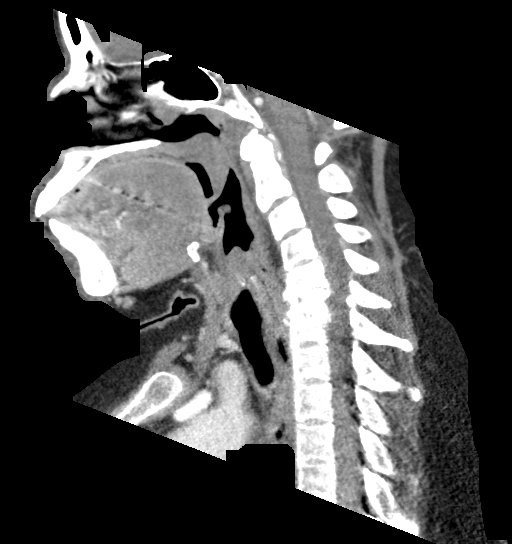
[im 51/101  bone]
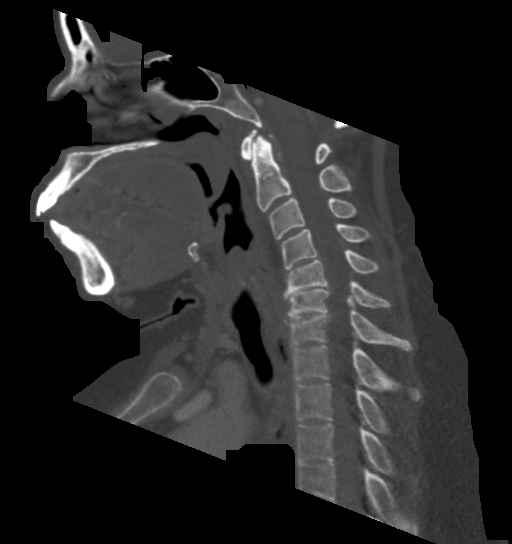
[im 59/101  bone]
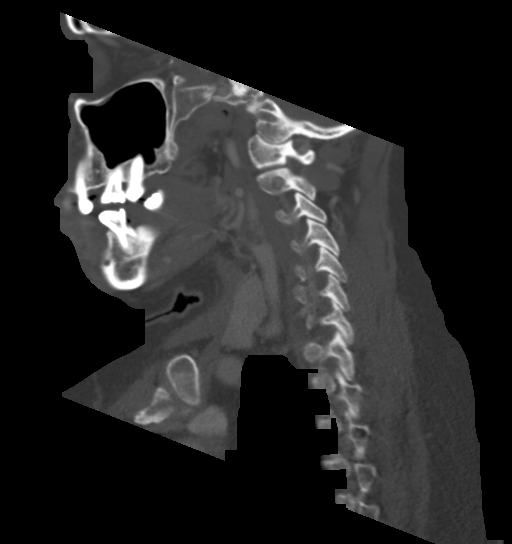
[im 67/101  bone]
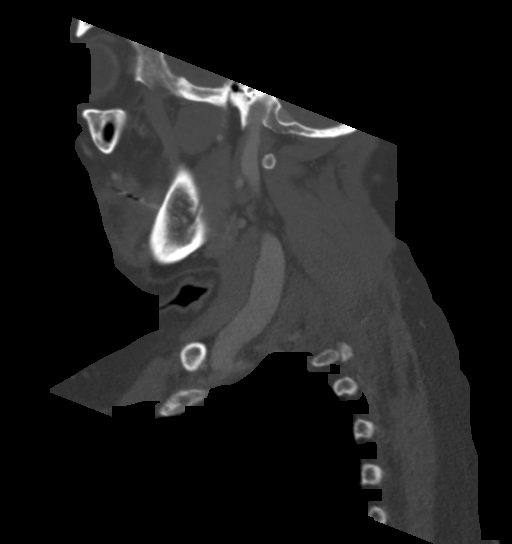

[Series 7: cor neck · coronal · 0.39mm/px · 3 of 101 slices shown]
[im 32/101  bone]
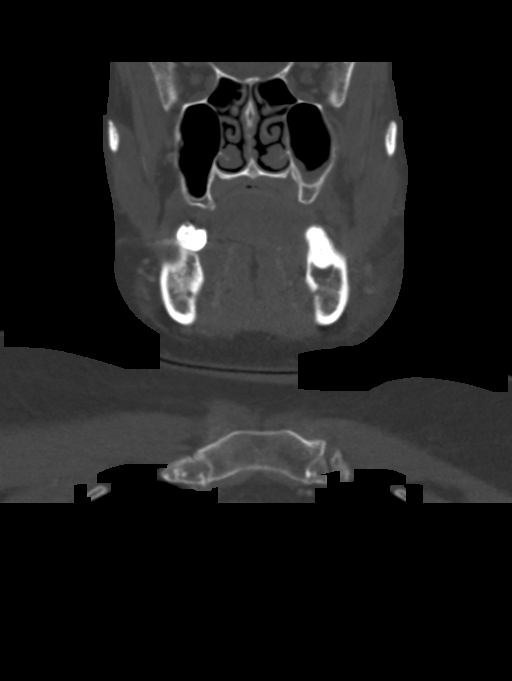
[im 44/101  bone]
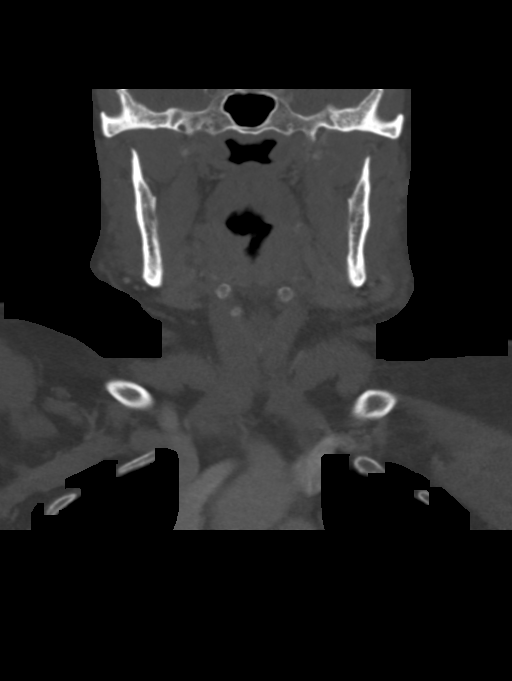
[im 57/101  bone]
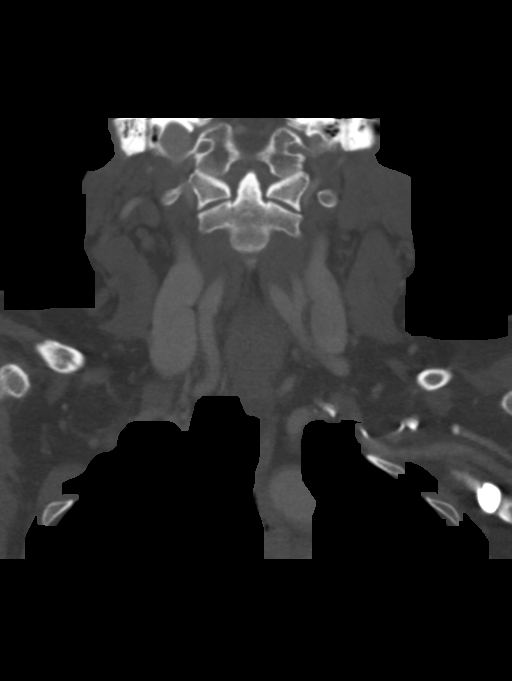

[Series 8: orthogonal ax · axial · 0.39mm/px · z∈[+223,+394]mm · 5 of 130 slices shown, 7 images]
[im 17/130  soft-tissue]
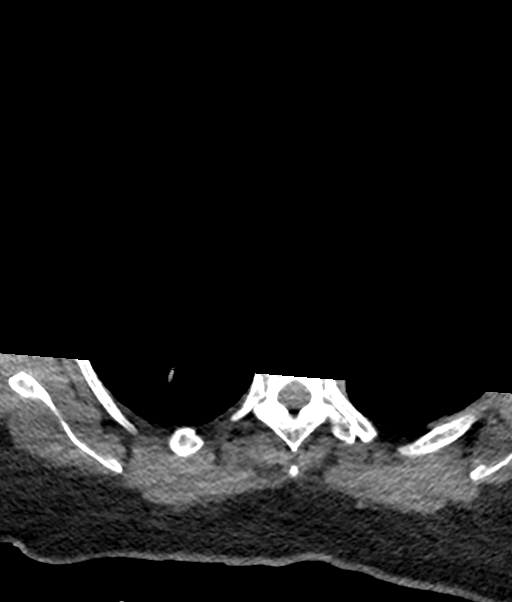
[im 17/130  bone]
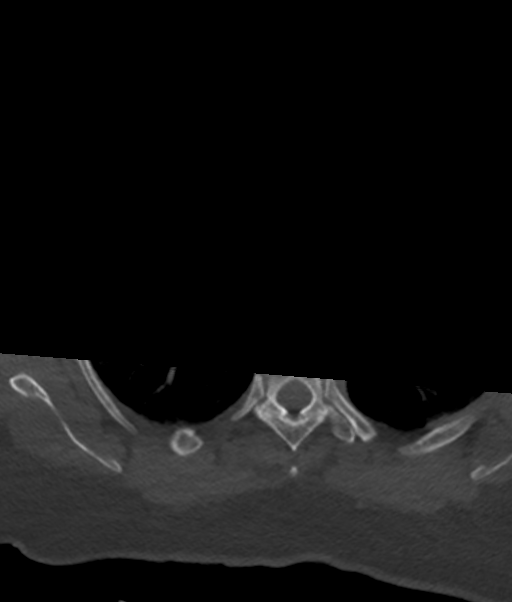
[im 49/130  bone]
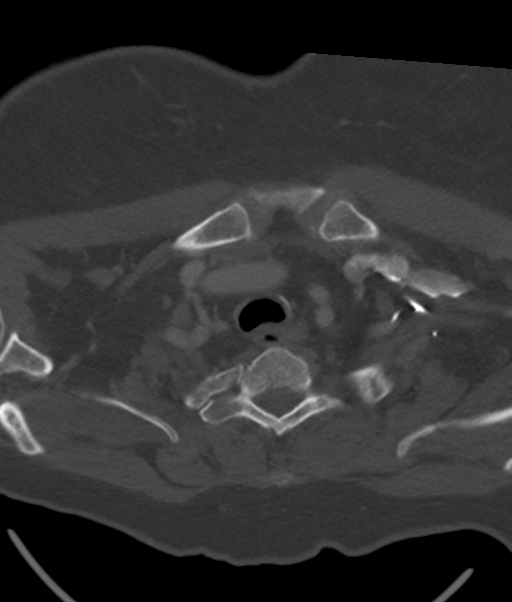
[im 65/130  bone]
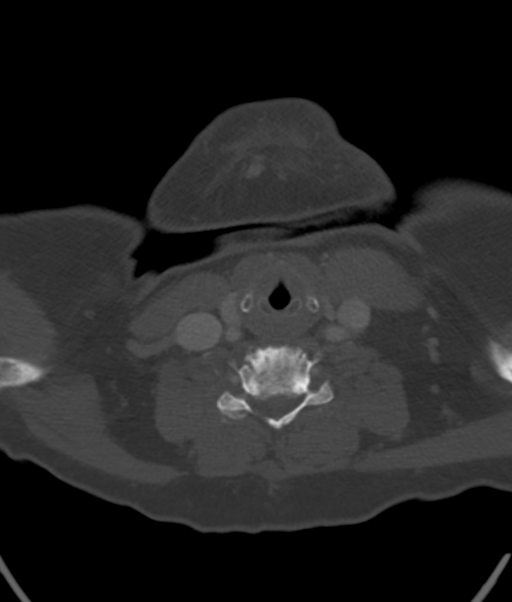
[im 81/130  bone]
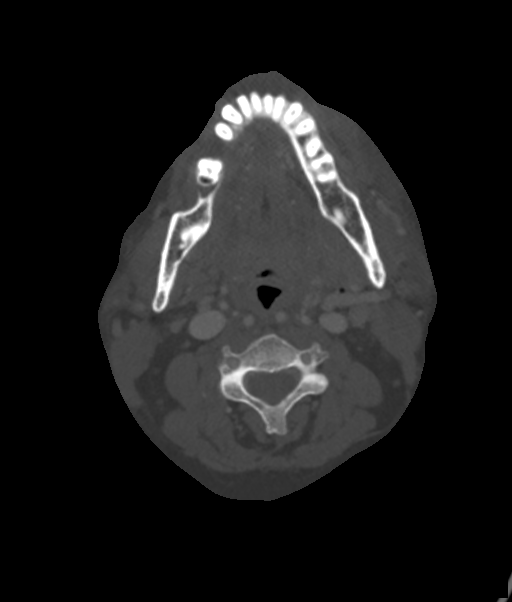
[im 113/130  soft-tissue]
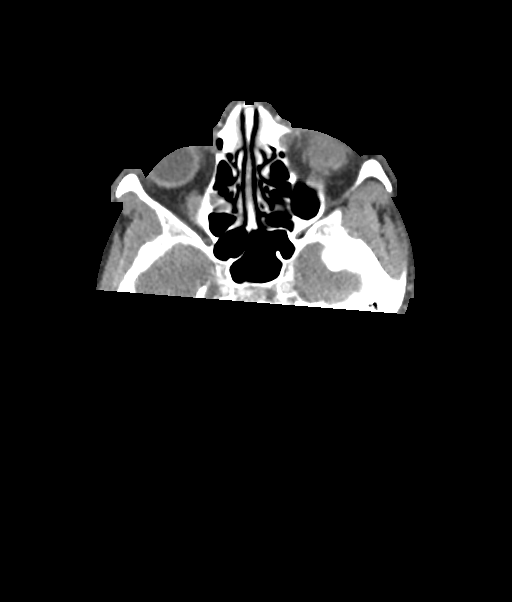
[im 113/130  bone]
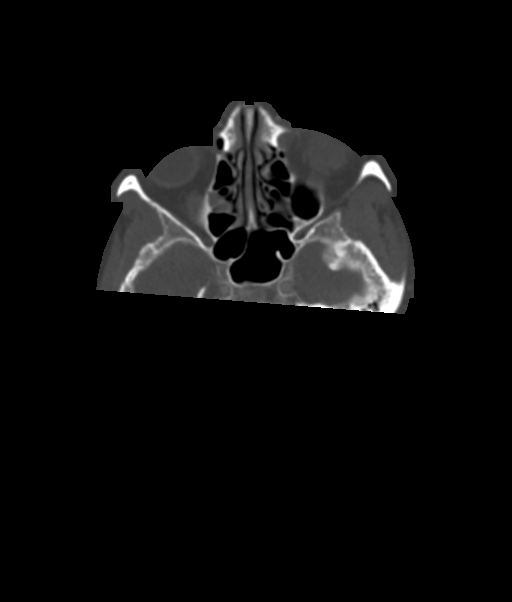

[13 of 33 positions shown; findings below may reference images not displayed]

FINDINGS: Inflammation in the left face surrounding a irregularly-shaped 12 x
16 mm rim enhancing fluid collection antro lateral to the left
maxilla, contiguous with devitalized solitary left upper molar which
has periapical erosion.

Pharynx and larynx: Normal. No mass or swelling.

Salivary glands: No inflammation, mass, or stone.

Thyroid: Normal.

Lymph nodes: None enlarged or abnormal density.

Vascular: Tortuous ICA with partial retropharyngeal course. No acute
finding.

Limited intracranial: Negative

Visualized orbits: No acute finding.  Bilateral cataract resection.

Mastoids and visualized paranasal sinuses: Mucosal thickening on the
floor of the left maxillary sinus, likely odontogenic.

Skeleton: Simple appearing 15 mm lipoma in the left subscapularis
region. Degenerative disc disease in the lower cervical spine.

Upper chest: No acute finding.
IMPRESSION: Odontogenic infection in the left face with 12 x 16 mm abscess
antro-lateral to the left maxilla alveolar process. The remaining
left upper molar is the offending tooth.

## 2017-08-06 DEATH — deceased
